# Patient Record
Sex: Female | Born: 1950 | Race: Black or African American | Hispanic: No | Marital: Married | Smoking: Current every day smoker
Health system: Southern US, Community
[De-identification: ages and names within clinical notes are randomized; demographics above are authoritative.]

## PROBLEM LIST (undated history)

## (undated) DIAGNOSIS — F329 Major depressive disorder, single episode, unspecified: Secondary | ICD-10-CM

## (undated) DIAGNOSIS — E119 Type 2 diabetes mellitus without complications: Secondary | ICD-10-CM

## (undated) DIAGNOSIS — I10 Essential (primary) hypertension: Secondary | ICD-10-CM

## (undated) DIAGNOSIS — F32A Depression, unspecified: Secondary | ICD-10-CM

## (undated) DIAGNOSIS — J45909 Unspecified asthma, uncomplicated: Secondary | ICD-10-CM

## (undated) DIAGNOSIS — M1990 Unspecified osteoarthritis, unspecified site: Secondary | ICD-10-CM

## (undated) DIAGNOSIS — F419 Anxiety disorder, unspecified: Secondary | ICD-10-CM

## (undated) DIAGNOSIS — R450 Nervousness: Secondary | ICD-10-CM

## (undated) HISTORY — DX: Major depressive disorder, single episode, unspecified: F32.9

## (undated) HISTORY — PX: GASTRIC BYPASS: SHX52

## (undated) HISTORY — DX: Anxiety disorder, unspecified: F41.9

## (undated) HISTORY — DX: Depression, unspecified: F32.A

## (undated) HISTORY — DX: Type 2 diabetes mellitus without complications: E11.9

---

## 1999-08-21 ENCOUNTER — Encounter: Payer: Self-pay | Admitting: Emergency Medicine

## 1999-08-21 ENCOUNTER — Inpatient Hospital Stay (HOSPITAL_COMMUNITY): Admission: EM | Admit: 1999-08-21 | Discharge: 1999-08-24 | Payer: Self-pay | Admitting: Emergency Medicine

## 2001-03-20 ENCOUNTER — Emergency Department (HOSPITAL_COMMUNITY): Admission: EM | Admit: 2001-03-20 | Discharge: 2001-03-20 | Payer: Self-pay | Admitting: *Deleted

## 2004-05-07 ENCOUNTER — Ambulatory Visit: Payer: Self-pay | Admitting: Psychiatry

## 2004-05-07 ENCOUNTER — Inpatient Hospital Stay (HOSPITAL_COMMUNITY): Admission: RE | Admit: 2004-05-07 | Discharge: 2004-05-10 | Payer: Self-pay | Admitting: Psychiatry

## 2004-06-23 ENCOUNTER — Other Ambulatory Visit (HOSPITAL_COMMUNITY): Admission: RE | Admit: 2004-06-23 | Discharge: 2004-07-10 | Payer: Self-pay | Admitting: Psychiatry

## 2004-06-23 ENCOUNTER — Ambulatory Visit: Payer: Self-pay | Admitting: Psychiatry

## 2005-04-29 ENCOUNTER — Emergency Department (HOSPITAL_COMMUNITY): Admission: EM | Admit: 2005-04-29 | Discharge: 2005-04-29 | Payer: Self-pay | Admitting: Emergency Medicine

## 2006-09-15 ENCOUNTER — Emergency Department (HOSPITAL_COMMUNITY): Admission: EM | Admit: 2006-09-15 | Discharge: 2006-09-15 | Payer: Self-pay | Admitting: Emergency Medicine

## 2006-10-26 ENCOUNTER — Ambulatory Visit (HOSPITAL_COMMUNITY): Admission: RE | Admit: 2006-10-26 | Discharge: 2006-10-26 | Payer: Self-pay | Admitting: General Surgery

## 2008-01-31 ENCOUNTER — Encounter: Payer: Self-pay | Admitting: Emergency Medicine

## 2008-01-31 ENCOUNTER — Inpatient Hospital Stay (HOSPITAL_COMMUNITY): Admission: AD | Admit: 2008-01-31 | Discharge: 2008-02-02 | Payer: Self-pay | Admitting: Cardiology

## 2008-02-27 ENCOUNTER — Ambulatory Visit (HOSPITAL_COMMUNITY): Admission: RE | Admit: 2008-02-27 | Discharge: 2008-02-27 | Payer: Self-pay | Admitting: General Surgery

## 2008-03-06 ENCOUNTER — Inpatient Hospital Stay (HOSPITAL_BASED_OUTPATIENT_CLINIC_OR_DEPARTMENT_OTHER): Admission: RE | Admit: 2008-03-06 | Discharge: 2008-03-06 | Payer: Self-pay | Admitting: Cardiology

## 2008-03-12 ENCOUNTER — Ambulatory Visit (HOSPITAL_COMMUNITY): Admission: RE | Admit: 2008-03-12 | Discharge: 2008-03-12 | Payer: Self-pay | Admitting: General Surgery

## 2008-05-14 ENCOUNTER — Encounter: Admission: RE | Admit: 2008-05-14 | Discharge: 2008-05-14 | Payer: Self-pay | Admitting: General Surgery

## 2008-08-07 ENCOUNTER — Ambulatory Visit: Payer: Self-pay | Admitting: Radiology

## 2008-08-07 ENCOUNTER — Encounter: Payer: Self-pay | Admitting: Emergency Medicine

## 2008-08-07 ENCOUNTER — Inpatient Hospital Stay (HOSPITAL_COMMUNITY): Admission: EM | Admit: 2008-08-07 | Discharge: 2008-08-09 | Payer: Self-pay | Admitting: Internal Medicine

## 2008-08-07 ENCOUNTER — Ambulatory Visit: Payer: Self-pay | Admitting: Cardiovascular Disease

## 2008-08-08 ENCOUNTER — Encounter (INDEPENDENT_AMBULATORY_CARE_PROVIDER_SITE_OTHER): Payer: Self-pay | Admitting: Internal Medicine

## 2008-10-08 ENCOUNTER — Ambulatory Visit (HOSPITAL_COMMUNITY): Admission: RE | Admit: 2008-10-08 | Discharge: 2008-10-08 | Payer: Self-pay | Admitting: General Surgery

## 2010-02-01 IMAGING — CT CT ANGIO CHEST
2 of 7 series · 19 of 36 positions shown · IV contrast (APPLIED)
Comparison: Portable chest 01/31/2008.

CLINICAL DATA: Elevated D-dimer.  Chest pain.

CT ANGIOGRAPHY CHEST
TECHNIQUE: Multidetector CT imaging of the chest using the
standard protocol during bolus administration of intravenous
contrast. Multiplanar reconstructed images including MIPs were
obtained and reviewed to evaluate the vascular anatomy.
Contrast: 100 ml [id number].

[Series 8: pulm embolism 1.0 thins · axial · 0.73mm/px · z∈[+1138,+1400]mm · 18 of 293 slices shown]
[im 16/293  lung]
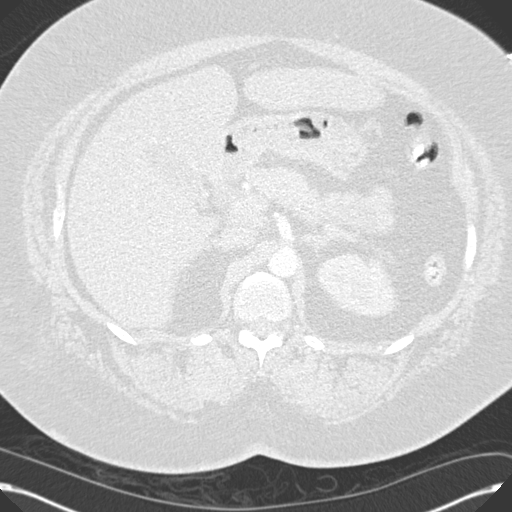
[im 31/293  mediastinal]
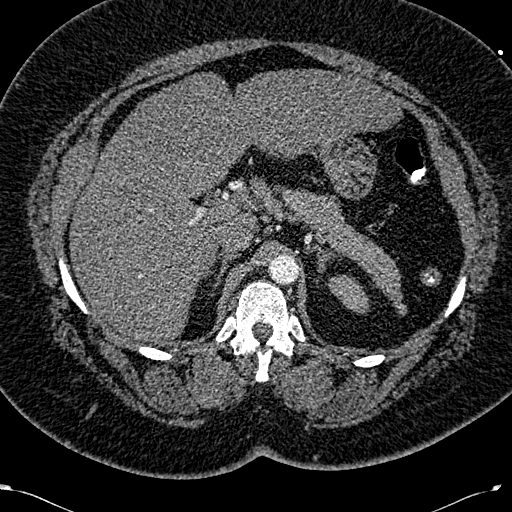
[im 47/293  lung]
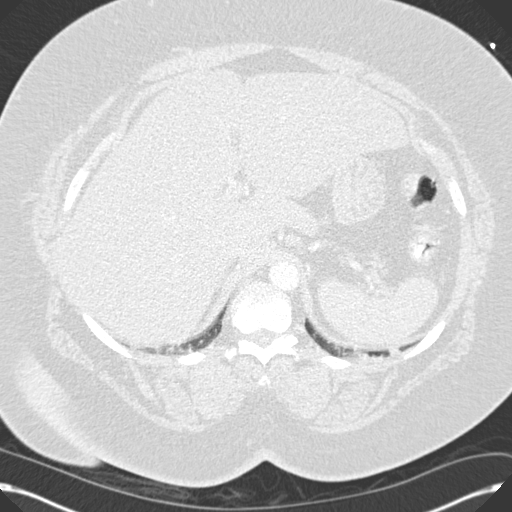
[im 62/293  mediastinal]
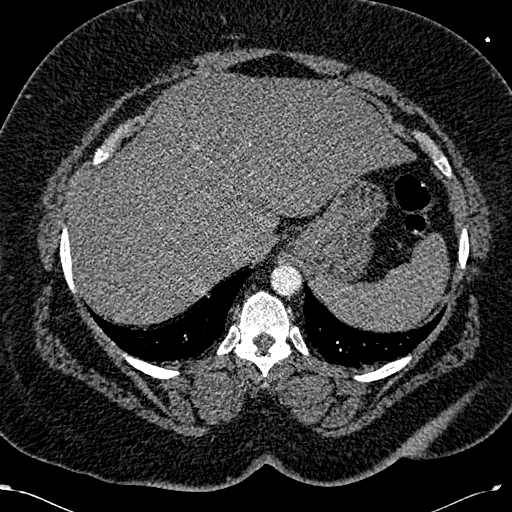
[im 77/293  lung]
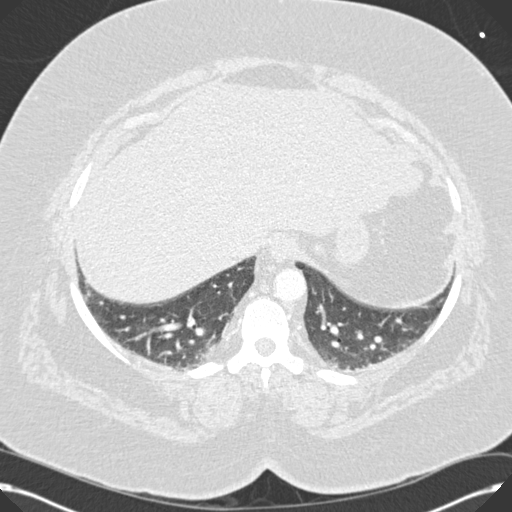
[im 93/293  mediastinal]
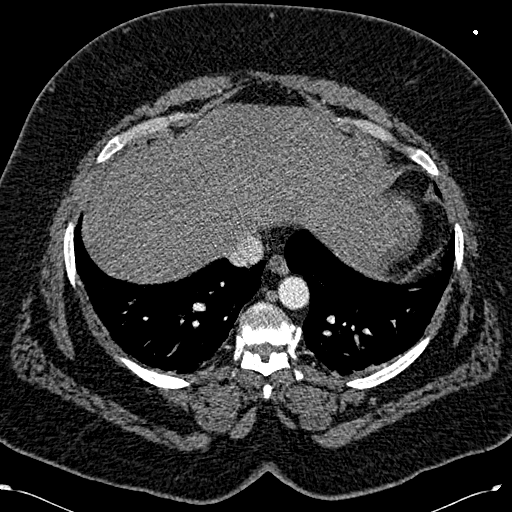
[im 108/293  lung]
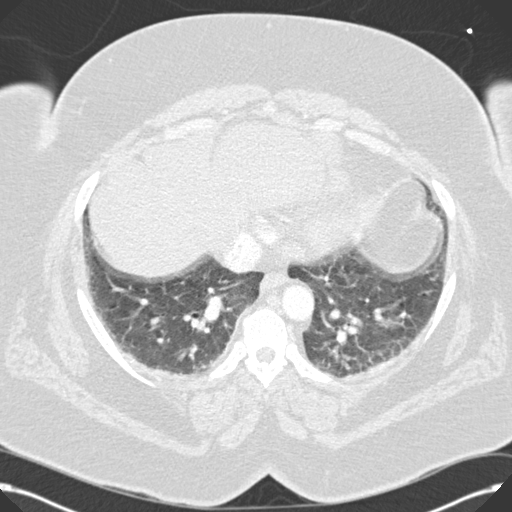
[im 123/293  mediastinal]
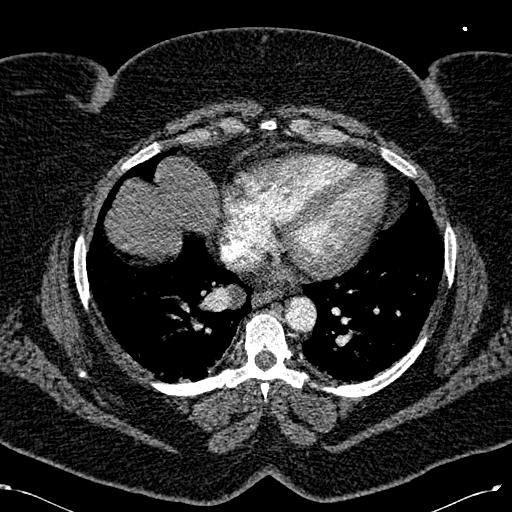
[im 139/293  lung]
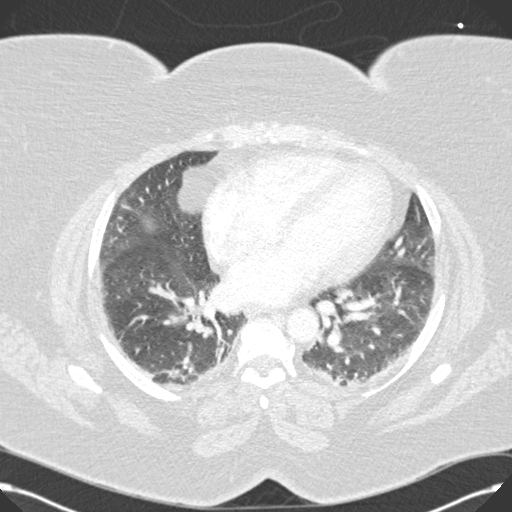
[im 154/293  mediastinal]
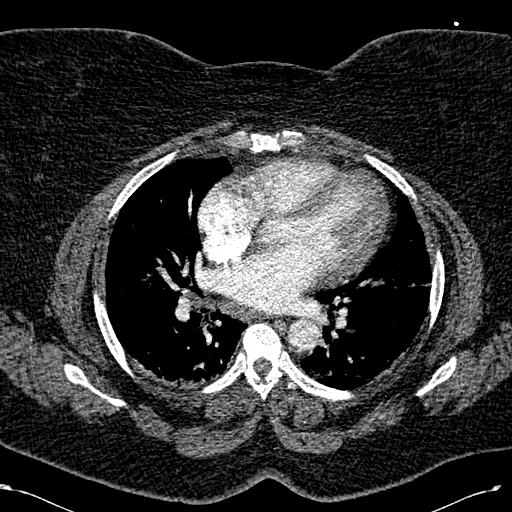
[im 170/293  lung]
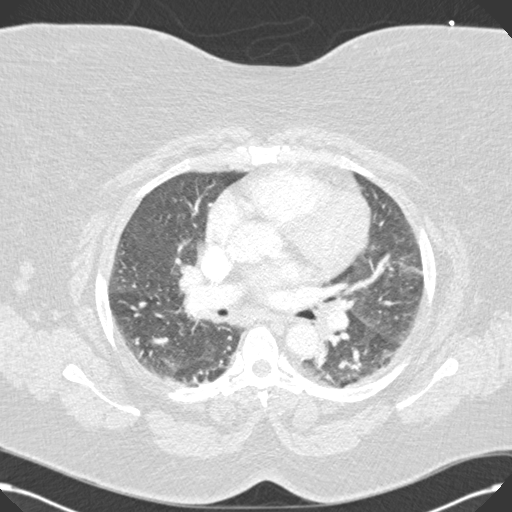
[im 185/293  mediastinal]
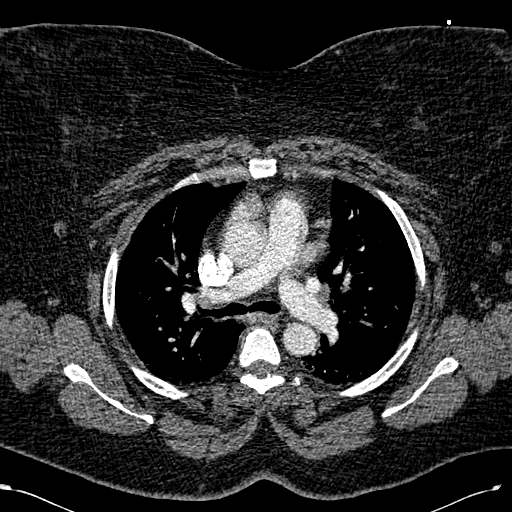
[im 200/293  lung]
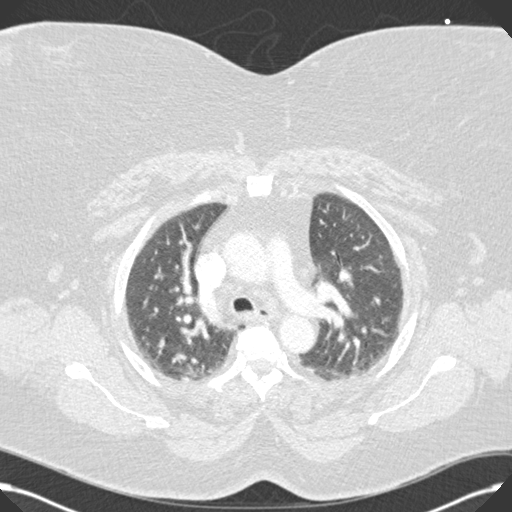
[im 216/293  mediastinal]
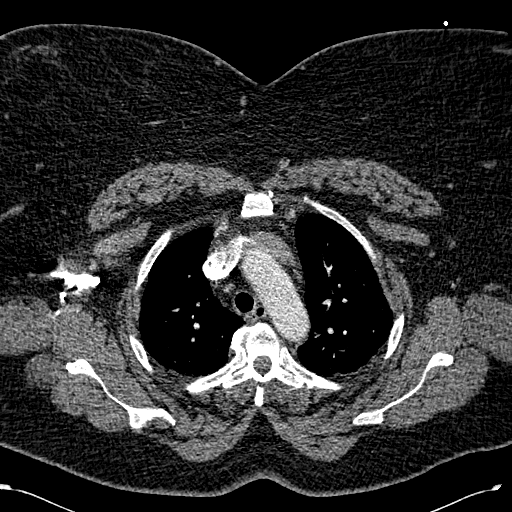
[im 231/293  lung]
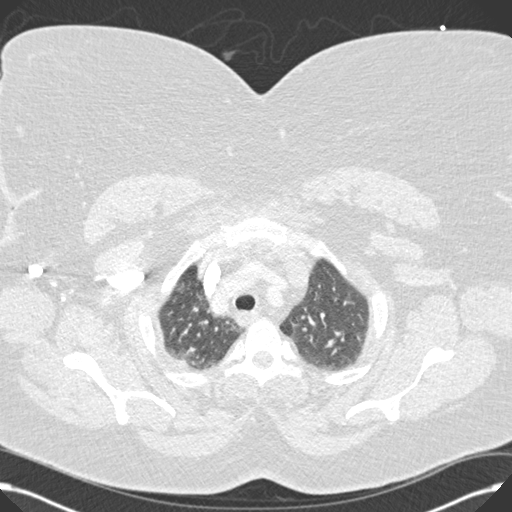
[im 246/293  mediastinal]
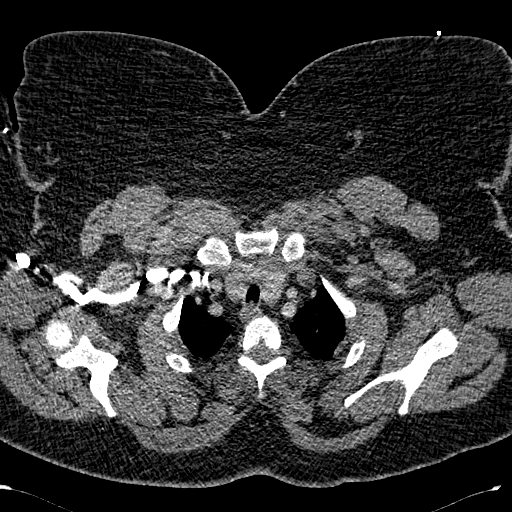
[im 262/293  lung]
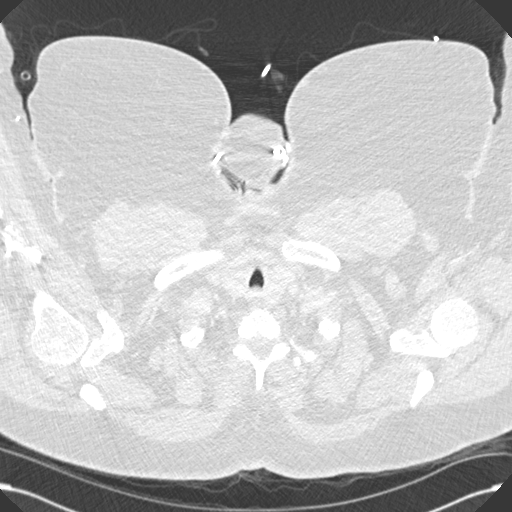
[im 277/293  mediastinal]
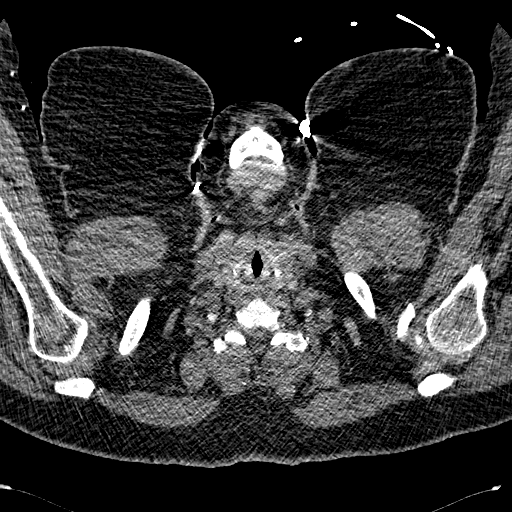

[Series 10: pulm embolism 2.0 cor · coronal · 0.74mm/px · 1 of 149 slices shown]
[im 75/149  mediastinal]
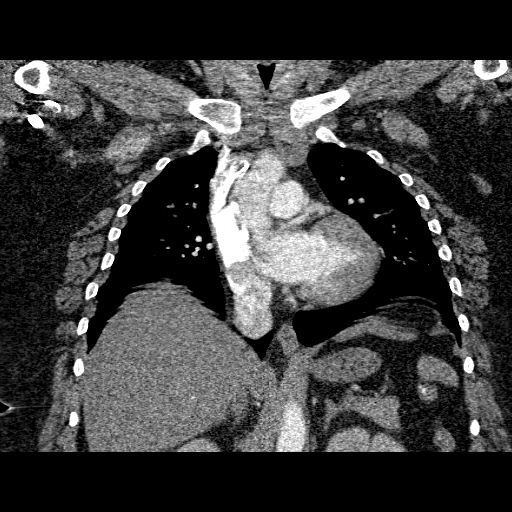

[19 of 36 positions shown; findings below may reference images not displayed]

FINDINGS: The study is somewhat limited by the patient's body
habitus. No CT evidence of pulmonary embolus is identified.  No
axillary, hilar or mediastinal lymphadenopathy is present.  There
is no pleural or pericardial effusion.  The lungs demonstrate some
scattered atelectatic change but are otherwise unremarkable.
Incidentally imaged upper abdomen is also unremarkable.  There is
no focal bony abnormality.
IMPRESSION: Negative for pulmonary embolus or acute cardiopulmonary disease.

## 2010-04-12 ENCOUNTER — Encounter: Payer: Self-pay | Admitting: Family Medicine

## 2010-07-01 LAB — COMPREHENSIVE METABOLIC PANEL
AST: 35 U/L (ref 0–37)
Albumin: 3.5 g/dL (ref 3.5–5.2)
BUN: 10 mg/dL (ref 6–23)
Calcium: 9.2 mg/dL (ref 8.4–10.5)
Chloride: 101 mEq/L (ref 96–112)
Creatinine, Ser: 1.04 mg/dL (ref 0.4–1.2)
GFR calc Af Amer: >60 mL/min (ref >60)
Total Protein: 8.2 g/dL (ref 6.0–8.3)

## 2010-07-01 LAB — CBC
HCT: 48.3 % — ABNORMAL HIGH (ref 36.0–46.0)
Hemoglobin: 16.1 g/dL — ABNORMAL HIGH (ref 12.0–15.0)
MCHC: 33.4 g/dL (ref 30.0–36.0)
MCV: 91.4 fL (ref 78.0–100.0)
Platelets: 249 K/uL (ref 150–400)
RBC: 5.28 MIL/uL — ABNORMAL HIGH (ref 3.87–5.11)
RDW: 14 % (ref 11.5–15.5)
WBC: 8.9 K/uL (ref 4.0–10.5)

## 2010-07-01 LAB — DIFFERENTIAL
Basophils Absolute: 0.1 K/uL (ref 0.0–0.1)
Basophils Relative: 1 % (ref 0–1)
Eosinophils Absolute: 0.4 K/uL (ref 0.0–0.7)
Eosinophils Relative: 4 % (ref 0–5)
Lymphocytes Relative: 42 % (ref 12–46)
Lymphs Abs: 3.8 K/uL (ref 0.7–4.0)
Monocytes Absolute: 0.6 10*3/uL (ref 0.1–1.0)
Monocytes Relative: 7 % (ref 3–12)
Neutro Abs: 4 10*3/uL (ref 1.7–7.7)
Neutrophils Relative %: 46 % (ref 43–77)

## 2010-07-01 LAB — CULTURE, BLOOD (ROUTINE X 2)
Culture: NO GROWTH
Culture: NO GROWTH

## 2010-07-01 LAB — CARDIAC PANEL(CRET KIN+CKTOT+MB+TROPI)
CK, MB: 3.2 ng/mL (ref 0.3–4.0)
CK, MB: 3.2 ng/mL (ref 0.3–4.0)
Relative Index: INVALID (ref 0.0–2.5)
Relative Index: INVALID (ref 0.0–2.5)
Total CK: 70 U/L (ref 7–177)
Total CK: 81 U/L (ref 7–177)
Total CK: 94 U/L (ref 7–177)
Troponin I: 0.01 ng/mL (ref 0.00–0.06)

## 2010-07-01 LAB — BASIC METABOLIC PANEL WITH GFR
BUN: 11 mg/dL (ref 6–23)
Chloride: 103 meq/L (ref 96–112)
Creatinine, Ser: 0.8 mg/dL (ref 0.4–1.2)
GFR calc non Af Amer: >60 mL/min (ref >60)
Glucose, Bld: 98 mg/dL (ref 70–99)

## 2010-07-01 LAB — BASIC METABOLIC PANEL
CO2: 27 mEq/L (ref 19–32)
CO2: 27 mEq/L (ref 19–32)
Calcium: 9.2 mg/dL (ref 8.4–10.5)
Chloride: 100 mEq/L (ref 96–112)
GFR calc Af Amer: >60 mL/min (ref >60)
GFR calc non Af Amer: 59 mL/min — ABNORMAL LOW (ref >60)
Glucose, Bld: 180 mg/dL — ABNORMAL HIGH (ref 70–99)
Potassium: 4.6 mEq/L (ref 3.5–5.1)
Potassium: 4.8 mEq/L (ref 3.5–5.1)
Sodium: 137 mEq/L (ref 135–145)
Sodium: 140 mEq/L (ref 135–145)

## 2010-07-01 LAB — BLOOD GAS, ARTERIAL
Bicarbonate: 26.2 mEq/L — ABNORMAL HIGH (ref 20.0–24.0)
Bicarbonate: 28.1 mEq/L — ABNORMAL HIGH (ref 20.0–24.0)
Delivery systems: POSITIVE
Drawn by: 24610
Expiratory PAP: 4
FIO2: 0.4 %
Mode: POSITIVE
O2 Content: 4 L/min
O2 Saturation: 96.4 %
Patient temperature: 98.6
TCO2: 29.8 mmol/L (ref 0–100)
pCO2 arterial: 49.2 mmHg — ABNORMAL HIGH (ref 35.0–45.0)
pH, Arterial: 7.328 — ABNORMAL LOW (ref 7.350–7.400)
pO2, Arterial: 66.2 mmHg — ABNORMAL LOW (ref 80.0–100.0)

## 2010-07-01 LAB — GLUCOSE, CAPILLARY
Glucose-Capillary: 155 mg/dL — ABNORMAL HIGH (ref 70–99)
Glucose-Capillary: 163 mg/dL — ABNORMAL HIGH (ref 70–99)
Glucose-Capillary: 183 mg/dL — ABNORMAL HIGH (ref 70–99)
Glucose-Capillary: 163 mg/dL — ABNORMAL HIGH (ref 70–99)

## 2010-07-01 LAB — POCT CARDIAC MARKERS
CKMB, poc: 1.8 ng/mL (ref 1.0–8.0)
Myoglobin, poc: 15 ng/mL (ref 12–200)
Troponin i, poc: <0.05 ng/mL (ref 0.00–0.09)

## 2010-07-01 LAB — POCT B-TYPE NATRIURETIC PEPTIDE (BNP): B Natriuretic Peptide, POC: 9.3 pg/mL (ref 0–100)

## 2010-07-01 LAB — LIPID PANEL: Cholesterol: 128 mg/dL (ref 0–200)

## 2010-07-01 LAB — HEMOGLOBIN A1C: Mean Plasma Glucose: 134 mg/dL

## 2010-08-05 NOTE — Consult Note (Signed)
NAMEHALEI, HANOVER NO.:  [id number]   MEDICAL RECORD NO.:  [id number]          PATIENT TYPE:  EMS   LOCATION:  MAJO                         FACILITY:  [facility]   PHYSICIAN:  Sharlet Salina T. Hoxworth, M.D.DATE OF BIRTH:  [birth year]   DATE OF CONSULTATION:  09/15/2006  DATE OF DISCHARGE:                                 CONSULTATION   CHIEF COMPLAINT:  Rectal pain and bleeding.   HISTORY OF PRESENT ILLNESS:  I was asked by Dr. Bruce Donath in the  emergency room at [facility] to evaluate [name].  She is a 60-  year-old black female who presents to the emergency room with anal pain.  She originally was seen at Dr. Tedra Senegal office today.  She complains of  probably 6-8 weeks of pain with bowel movements at her anus.  This  initially was tolerable.  It has gradually worsened.  She does have to  strain some with her stools.  Over the past 2-3 weeks, she has had a  small amount of blood with bowel movements.  Bowel movements have become  extremely painful.  The pain was much worse today and she presented.  She has had some mild constipation and straining.  She has no history of  any similar symptoms.  No fever or chills.  No drainage other than the  small amount of blood with bowel movements.   PAST MEDICAL HISTORY:  She is followed for diabetes mellitus,  hypertension and asthma.   PREVIOUS SURGERY INCLUDES:  Ovarian cystectomy.   MEDICATIONS:  Glucophage.   ALLERGIES:  NO KNOWN DRUG ALLERGIES.   SOCIAL HISTORY:  She is married, accompanied by her husband.  Denies  cigarettes, alcohol.   FAMILY HISTORY:  Noncontributory.   REVIEW OF SYSTEMS:  GENERAL:  No fever, chills.  ABDOMEN:  No abdominal  pain, nausea, vomiting.   PHYSICAL EXAM:  She is afebrile, heart rate is 109, blood pressure  121/77, respirations 22.  GENERAL:  She is an obese, black female, appears uncomfortable.  SKIN:  Slightly diaphoretic.  CHEST:  Clear without wheeze or increased work of  breathing.  CARDIAC:  Regular mild tachycardia.  No murmurs.  ABDOMEN:  Soft, nontender.  No masses or organomegaly.  RECTAL:  She is exquisitely tender on rectal exam.  The internal anal  sphincter feels hypertrophied.  There is a small amount of blood on the  examining finger.  Her tenderness is discrete in the posterior midline.  There is no perirectal induration, swelling or tenderness.   LABORATORY AND X-RAY DATA:  Per the physician assist in the emergency  room, CBC was normal.   ASSESSMENT/PLAN:  Rectal pain and bleeding entirely consistent with an  anal fissure.  I do not see any evidence of abscess or infection.  Going  to go ahead and start her on diltiazem cream perianally to try to relax  the internal sphincter, as well as topical anesthetics and stool  softeners.  I will see her back in 7-10 days in the office.  This may  require lateral internal anal sphincterotomy.  Lorne Skeens. Hoxworth, M.D.  Electronically Signed     BTH/MEDQ  D:  09/15/2006  T:  09/16/2006  Job:  [id number]   cc:   Renaye Rakers, M.D.

## 2010-08-05 NOTE — Cardiovascular Report (Signed)
[name], [name] NO.:  [id number]   MEDICAL RECORD NO.:  [id number]          PATIENT TYPE:  OIB   LOCATION:  1962                         FACILITY:  [facility]   PHYSICIAN:  Mohan N. Sharyn Lull, M.D. DATE OF BIRTH:  [birth year]   DATE OF PROCEDURE:  03/06/2008  DATE OF DISCHARGE:  03/06/2008                            CARDIAC CATHETERIZATION   PROCEDURE:  Left cardiac catheterization with selective left and right  coronary angiography, left ventriculography via right groin using  Judkins technique.   INDICATIONS FOR PROCEDURE:  [name] is a 60 year old black female  with past medical history significant for hypertension, non-insulin-  dependent diabetes mellitus, hypercholesteremia, morbid obesity, and  unstable angina.  She had mildly positive Persantine Myoview, recently  discharged from the hospital, history of bronchial asthma, and positive  family history of coronary artery disease, who came to office  complaining of retrosternal chest tightness off and on grade 4/10  radiating to the left arm associated with numbness in the left arm,  relieved with rest.  Also gives history of exertional dyspnea with  minimal exertion.  Denies any nausea, vomiting, diaphoresis, PND,  orthopnea, or leg swelling.  Denies palpitation, lightheadedness, or  syncope.  The patient initially decided for medical management, recently  discharged from the hospital, had Persantine Myoview which showed mild  area of ischemia in the apex but due to recurrent chest pain decided to  proceed with left cath.  Discussed with the patient at length regarding  risks and benefits, i.e., death, MI, stroke, local vascular  complications, bleeding, infection, etc., and consented for the  procedure.   PROCEDURE:  After obtaining the informed consent, the patient was  brought to the Cath Lab and was placed on fluoroscopy table.  The right  groin was prepped and draped in the usual fashion.  Xylocaine  1% was  used for local anesthesia in the right groin.  With the help of thin-  wall needle, a 4-French arterial sheath was placed.  The sheath was  aspirated and flushed.  Next, a 4-French left Judkins catheter was  advanced over the wire under fluoroscopic guidance up to the ascending  aorta.  Wire was pulled out.  The catheter was aspirated and connected  to the manifold.  Catheter was further advanced and engaged into left  coronary ostium.  Multiple views of the left system were taken.  Next,  catheter was engaged and was pulled out over the wire and was replaced  with a 4-French 3-D right diagnostic catheter which was advanced over  the wire under fluoroscopic guidance up to the ascending aorta.  Wire  was pulled out.  The catheter was aspirated and connected to the  manifold.  Catheter was further advanced and engaged into right coronary  ostium.  Multiple views of the right system were obtained.  Next, the  catheter was disengaged and was pulled out over the wire and was  replaced with 4-French pigtail catheter which was advanced over the wire  under fluoroscopic guidance up to the ascending aorta.  Wire was pulled  out.  The catheter  was aspirated and connected to the manifold.  Catheter was further advanced across aortic valve into the LV.  LV  pressures were recorded.  Next, LV graft was done in 30-degree RAO  position.  Post angio, LV pressures were recorded.  Next, LV-graphy was  done in 30-degree RAO position.  Postangiographic pressures were  recorded from LV and then pullback pressures were recorded from the  aorta.  There was no significant gradient across aortic valve.  Next,  the pigtail catheter was pulled out over the wire.  Sheaths were  aspirated and flushed.   FINDINGS:  LV showed good LV systolic function, EF of 60-65%.  Left main  was long which was patent.  LAD was small which was patent but diffusely  diseased distally.  There were no focal stenoses.  Diagonal  1 and 2 were  very, very small.  Left circumflex was patent which tapers down after  giving off very large OM-1 into the AV groove.  OM-1 is very large which  is patent.  RCA is patent proximally and has about 10% mid stenosis.  PDA is patent.  PLV branches are small which are patent.   The patient tolerated the procedure well.  There are no complications.  The patient was transferred to the recovery room in stable condition.      [name]. Sharyn Lull, M.D.  Electronically Signed     MNH/MEDQ  D:  03/06/2008  T:  03/06/2008  Job:  [id number]   cc:   Cath Lab

## 2010-08-05 NOTE — Discharge Summary (Signed)
NAMELEMOYNE, SCARPATI NO.:  [id number]   MEDICAL RECORD NO.:  [id number]          PATIENT TYPE:  INP   LOCATION:  2039                         FACILITY:  [facility]   PHYSICIAN:  Eduardo Osier. Sharyn Lull, M.D. DATE OF BIRTH:  05-07-1950   DATE OF ADMISSION:  01/31/2008  DATE OF DISCHARGE:  02/02/2008                               DISCHARGE SUMMARY   ADMITTING DIAGNOSES:  1. Chest pain, rule out myocardial infarction.  2. Abdominal pain, rule out gastritis.  3. Gallbladder disease.  4. Hypertension.  5. Morbid obesity.  6. History of bronchial asthma.  7. Diabetes mellitus.  8. Positive family history of coronary artery disease.   FINAL DIAGNOSES:  1. Status post chest pain, myocardial infarction ruled out, mildly      positive Persantine Myoview.  2. Hypertension.  3. Non-insulin-dependent diabetes mellitus.  4. Hypercholesteremia.  5. Morbid obesity.  6. History of bronchial asthma.  7. Positive family history of coronary artery disease.   DISCHARGE MEDICATIONS:  1. Enteric-coated aspirin 81 mg 1 tablet daily.  2. Toprol XL 50 mg 1 tablet daily.  3. Lisinopril 5 mg 1 tablet daily.  4. Vistaril 100 mg 1 tablet daily.  5. Protonix 40 mg 1 tablet twice daily.  6. Metformin 500 mg 1 tablet daily as before.  7. Nitrostat 0.4 mg sublingually as directed.   DIET:  Low salt, low cholesterol, 1800 calories ADA diet.  The patient  has been advised to reduce weight.  The patient had lengthy discussion  with nutrition and agrees with changing her lifestyle and reducing  weight.   ACTIVITY:  As tolerated.  The patient has been advised to monitor blood  sugar daily.   FOLLOWUP:  Follow up with me in 1 week.   CONDITION AT DISCHARGE:  Stable   BRIEF HISTORY AND HOSPITAL COURSE:  Ms. Vasko is a 60 year old black  female with past medical history significant for hypertension, non-  insulin-dependent diabetes mellitus, history of bronchial asthma, and  morbid obesity.   [facility] complaining of  retrosternal and epigastric and right upper quadrant abdominal pain  associated with nausea and diaphoresis.  She states chest pain was sharp  and tightness, lasted for few seconds to a minute, which also increased  with deep breathing.  The patient was noted to have minimally elevated  troponin I and was transferred to [facility] for further  evaluation.  Denies any history of exertional chest pain.  She states  for last few days, she is having abdominal pain and right upper quadrant  pain off and on.  Denies any fever or chills.  Denies any cough.  Denies  any urinary complaints.   PAST MEDICAL HISTORY:  As above.   PAST SURGICAL HISTORY:  She had ovarian cyst resection at the age of 60,  had rectal fissure surgery in the past.   SOCIAL HISTORY:  She is married, no children, smoked 4-5 cigarettes per  day when under distress in the past and drinks beer socially.  Smoked  marijuana many years ago.  No  history of cocaine abuse.   FAMILY HISTORY:  Father died of MI at the age of 58.  Mother died of  complications of polio at the age of 76.  One sister died of MI at the  age of 63.  One sister had MI at age of 58.  Two brothers in good  health.   MEDICATION AT HOME:  She is on Glucophage and questionable blood  pressure medicine.   PHYSICAL EXAMINATION:  GENERAL:  On examination, she is alert, awake,  and oriented x3, in no acute distress.  VITAL SIGNS:  Blood pressure was 102/77, pulse was 88 regular.  HEENT:  Conjunctivae was pink.  NECK:  Supple.  No JVD.  No bruit.  LUNGS:  Clear to auscultation without rhonchi and rales.  CARDIOVASCULAR:  S1 and S2 was normal.  There was soft systolic murmur.  There was no S3 or S4 gallop.  ABDOMEN:  Soft, obese.  There was mild epigastric and right upper  quadrant tenderness.  There was no guarding.  EXTREMITIES:  There is no clubbing, cyanosis, or edema.   LABS:  Hemoglobin  was 17.2, hematocrit 52.6, and white count of 10.9.  BNP was less than 5.  Sodium was 136, potassium 4.5, glucose 110, BUN  13, and creatinine 1.0.  Her amylase and lipase were normal.  Three sets  of cardiac markers were normal.  D-dimer was slightly elevated 1.04.  She had spiral CT of the chest, which showed no evidence of pulmonary  embolism.  Chest x-ray showed no acute cardiopulmonary process.  Persantine Myoview scan showed mild reduce activity in the cardiac cath  and on the stress images suspicious for small apical inducible ischemia  with mild apical hypokinesia with EF of 59%.  Her cholesterol was 131,  HDL was low 30, and LDL was 80.   BRIEF HOSPITAL COURSE:  The patient was transferred from [facility] to stepdown unit.  MI was ruled out by serial enzymes and EKG.  The patient underwent subsequently Persantine Myoview, which showed very  mild area of reversible ischemia in the apex as above.  The patient did  not had any episodes of typical anginal chest pain in the hospital.  The  patient has been ambulating in hallway without any problems.  Discussed  with the patient at length regarding mildly positive Persantine Myoview  and various options of treatment i.e. medical versus invasive left cath  possible PTCA stenting, risks and benefits and agreed for medical  management for now.  The  patient understands that when she gets recurrent chest pressure will use  nitro and call 911 immediately and may consider cardiac cath as an  outpatient if she gets recurrent chest pain.  The patient will be  discharged home today and will be followed up in my office in 1 week.      Eduardo Osier. Sharyn Lull, M.D.  Electronically Signed     MNH/MEDQ  D:  02/02/2008  T:  02/03/2008  Job:  [id number]

## 2010-08-05 NOTE — Op Note (Signed)
[name], [name]                ACCOUNT NO.:  [id number]   MEDICAL RECORD NO.:  [id number]          PATIENT TYPE:  AMB   LOCATION:  DAY                          FACILITY:  [facility]   PHYSICIAN:  Sharlet Salina T. Hoxworth, M.D.DATE OF BIRTH:  November 09, 1950   DATE OF PROCEDURE:  10/26/2006  DATE OF DISCHARGE:                               OPERATIVE REPORT   PRE AND POSTOPERATIVE DIAGNOSIS:  Anal fissure.   SURGICAL PROCEDURES:  Lateral internal anal sphincterotomy.   SURGEON:  Lorne Skeens. Hoxworth, M.D.   ANESTHESIA:  General.   BRIEF HISTORY:  [name] is a 60 year old female who presented  recently with worsening persistent severe anal pain, small amount of  bleeding with bowel movements.  Examination initially was consistent  with a posterior midline anal fissure and hypertrophy of the lateral end  of the internal anal sphincter.  She initially responded well to  diltiazem cream but now has recurrent severe symptoms.  I have  recommended proceeding with lateral internal anal sphincterotomy under  general anesthesia.  The nature of the procedure, the indications, risks  of anesthesia, bleeding, infection, recurrent fissure and degrees of  incontinence were discussed and understood.  She is now brought  operating room for this procedure.   DESCRIPTION OF OPERATION:  The patient brought operating room and placed  in supine position on the operating table and general endotracheal  anesthesia was induced.  She was carefully positioned in lithotomy  position and the perineum sterilely prepped and draped.  Correct patient  procedure were verified.  Rectal exam under anesthesia revealed a very  tight internal anal sphincter and a fairly deep posterior fissure and  small skin tag.  The anus had to be gently dilated to even admit the  small end of the Sims retractor.  The retractor was placed and the  internal anal sphincter was identified in the right lateral position and  was quite taut  and constricted.  A small incision was made in the  intersphincteric groove and hemostat inserted into the intersphincteric  groove and the internal anal sphincter elevated up into the small  incision and was divided with cautery with good relaxation of the  sphincter back to a normal state.  Hemostasis was obtained with pressure  and the perianal area was infiltrated with 20 mL of 0.25% Marcaine with  epinephrine.  The sponge needle instrument counts correct.  Gauze  dressing was applied.  The patient taken to recovery in good condition.      Lorne Skeens. Hoxworth, M.D.  Electronically Signed     BTH/MEDQ  D:  10/26/2006  T:  10/26/2006  Job:  [id number]

## 2010-08-08 IMAGING — CR DG CHEST 2V
2 series · 2 of 2 positions shown · non-contrast
Comparison: Radiographs 08/08/2008

CLINICAL DATA: Shortness of breath

CHEST - 2 VIEW

[w chest pa]
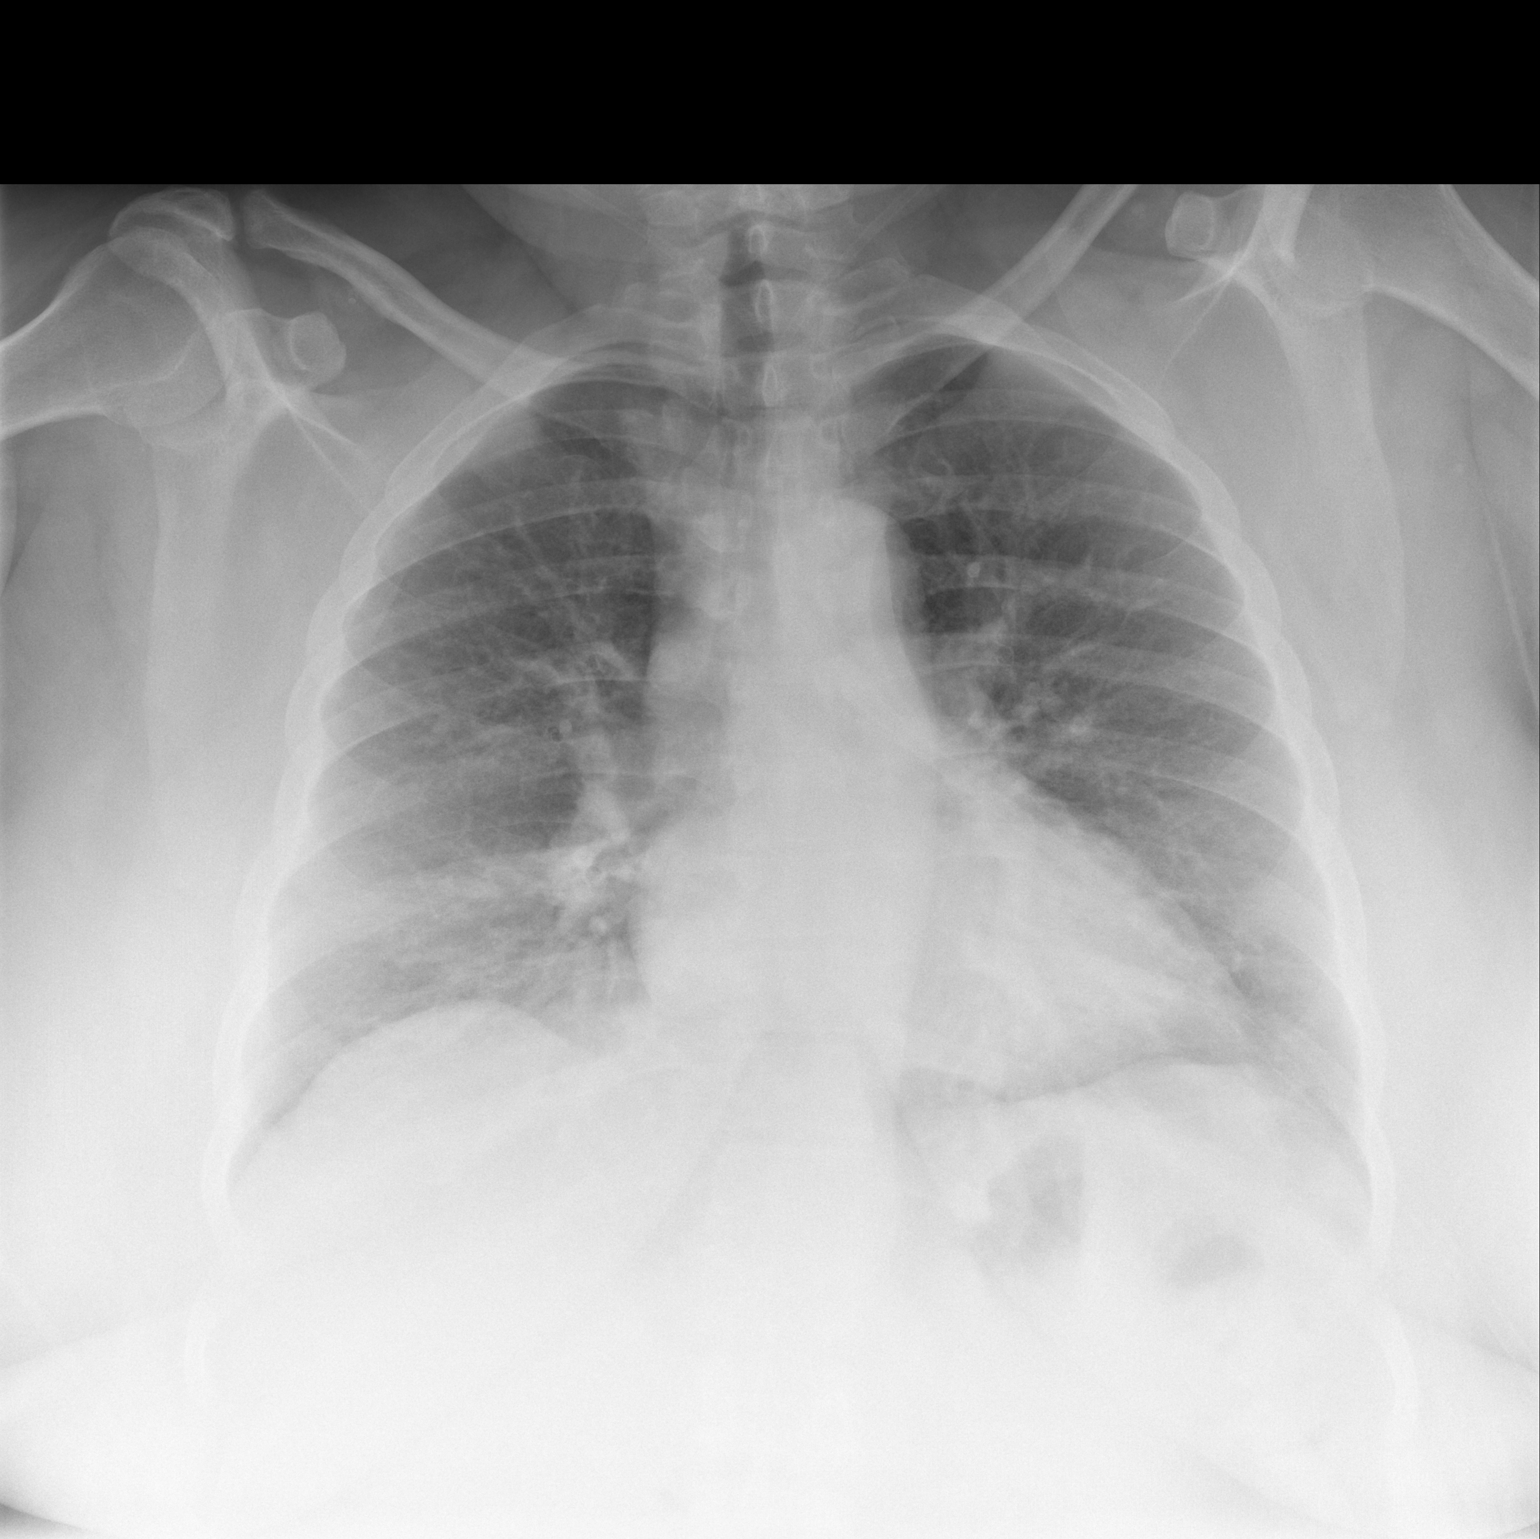

[w chest lat]
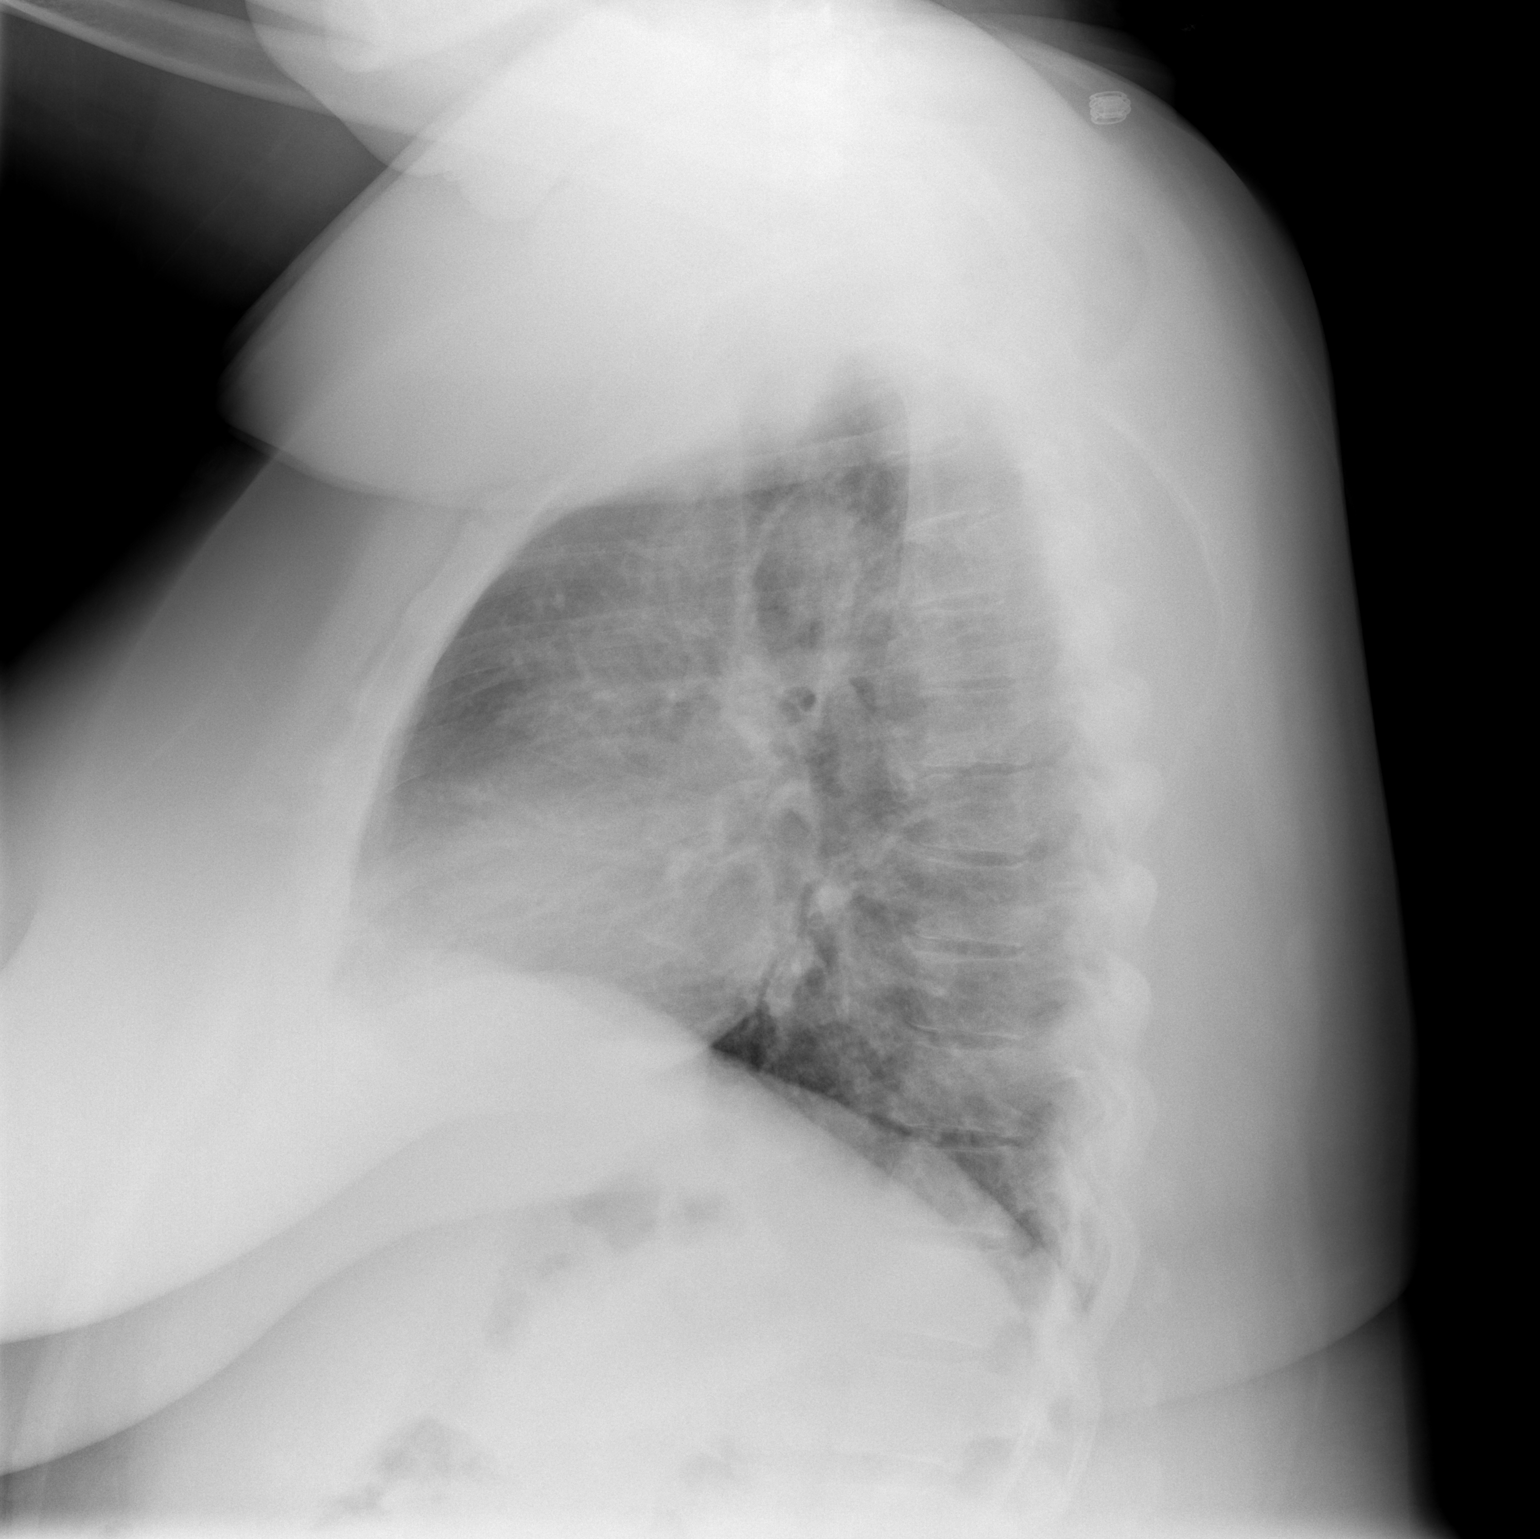

[2 of 2 positions shown; findings below may reference images not displayed]

FINDINGS: Heart upper normal.  No adenopathy.  Interstitial
densities are chronic.  Negative for edema, infiltrates or
effusions.
IMPRESSION: Chronic interstitial changes.  No acute cardiopulmonary process.

## 2010-08-08 IMAGING — CR DG CHEST 1V PORT
1 series · 1 of 1 positions shown · non-contrast
Comparison: January 31, 2008

CLINICAL DATA: Chest pain

PORTABLE CHEST - 1 VIEW

[view not recorded]
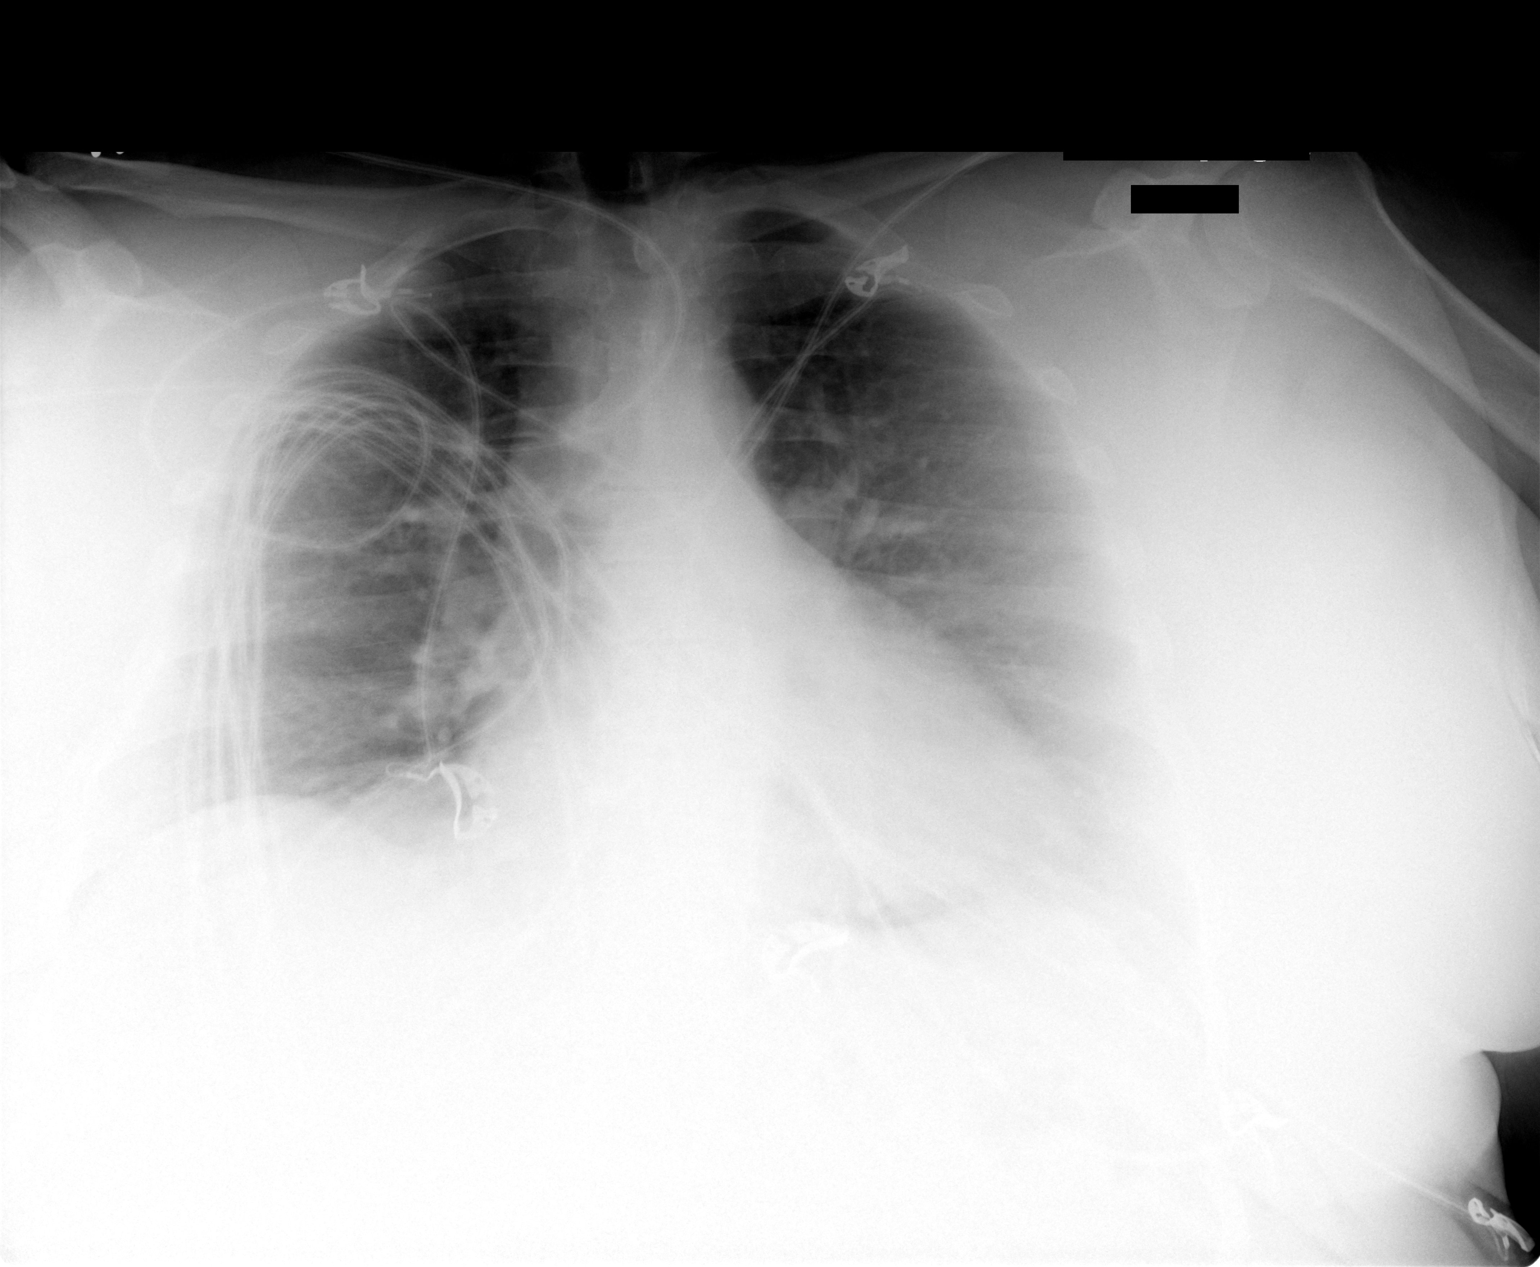

[1 of 1 positions shown; findings below may reference images not displayed]

FINDINGS: The cardiac silhouette, mediastinum, pulmonary
vasculature are within normal limits.  Both lungs are grossly clear
but the film is limited by underpenetration and motion.
IMPRESSION: Unremarkable, limited portable chest x-ray.

An upright PA and lateral chest series may be of help when the
patient is able.

## 2010-08-08 NOTE — Discharge Summary (Signed)
[name], DAURIA NO.:  [id number]   MEDICAL RECORD NO.:  [id number]          PATIENT TYPE:  INP   LOCATION:  2925                         FACILITY:  [facility]   PHYSICIAN:  Ruthy Dick, MD    DATE OF BIRTH:  [birth year]   DATE OF ADMISSION:  08/07/2008  DATE OF DISCHARGE:  08/09/2008                               DISCHARGE SUMMARY   The patient left against medical advise on Aug 09, 2008.   HISTORY OF PRESENT ILLNESS AND HOSPITAL COURSE:  This is a 60 year old  female who was transferred from [facility] for  respiratory distress secondary to asthma exacerbation.  She had come in  with shortness of breath and was treated with BiPAP.  Steroid was also  introduced.  She was beginning to get better, but before full recovery  and before I could see her on date Aug 09, 2008, she signed out against  medical advice.   FINAL DIAGNOSES:  1. Asthma exacerbation.  2. Respiratory failure to possible chronic obstructive pulmonary      disease exacerbation.  3. Diabetes mellitus type 2.  4. Hypertension.  5. Coronary artery disease.  6. Depression.  7. Dyslipidemia.   Because she signed against medical advice, arrangement could not be made  for outpatient followup of discharge medications and discharge  instructions.  She is always welcome to come back to the hospital if she  so desires.      Ruthy Dick, MD  Electronically Signed     GU/MEDQ  D:  09/04/2008  T:  09/04/2008  Job:  [id number]

## 2010-08-08 NOTE — Discharge Summary (Signed)
[facility]  Patient:    [name], [name]                         MRN: [id number] Adm. Date:  [id number] Disc. Date: [id number] Attending:  Beverely Low                           Discharge Summary  REASON FOR ADMISSION:  Asthma exacerbation.  DISCHARGE DIAGNOSIS:  Asthma.  HISTORY OF PRESENT ILLNESS:  The patient is a 60 year old black female who presented with acute asthma exacerbation.  The patient had been developing progressive wheezing and shortness of breath over the past week.  She has a history of asthma but had not been following up with her physician for her asthma managment.  In addition she is a patient who is a diabetic patient who also had not been compliant with her followup visits and medications.  She was started on albuterol nebulized treatments for her symptoms.  She was maintained on O2 to maintain her saturations greater than 92%.  She continued on her home meds.  The patient progressed to the point that her exacerbation had resolved.  She was discharged home in stable condition.  DISPOSITION:  The patient was to resume her regular diet and activity.  She was to follow up with Dr. Jeanella Anton in the office within one week.  DISCHARGE MEDICATIONS: 1. Albuterol inhaler two puffs q.4-6h. p.r.n. 2. Zithromax 250 mg one p.o. q.d. for three additional days. 3. Claritin 10 mg q.d. 4. Humibid DM one p.o. q.12h. 5. Prednisone 60 mg q.d. x 3 days, then 40 mg q.d. x 2 days, then 20 mg    q.d. x 2 days, then 10 mg q.d. x 3 days. DD:  08/31/99 TD:  09/05/99 Job: [id number]
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
## 2010-08-08 NOTE — H&P (Signed)
[name], [name] NO.:  [id number]   MEDICAL RECORD NO.:  [id number]          PATIENT TYPE:  INP   LOCATION:  2925                         FACILITY:  [facility]   PHYSICIAN:  Zannie Cove, MD     DATE OF BIRTH:  [birth year]   DATE OF ADMISSION:  08/07/2008  DATE OF DISCHARGE:  08/09/2008                              HISTORY & PHYSICAL   CHIEF COMPLAINT:  Shortness of breath.   HISTORY OF PRESENT ILLNESS:  The patient is a 60 year old female  transferred from [facility] for respiratory distress due  to asthma exacerbation.  The patient reports shortness of breath  associated with wheezing and cough productive of yellow sputum for about  1 week without improvement with nebs at home.  She went to work today  where it worsened, and sent to the ED.  In [facility] ER,  she received treatments of Xopenex, 125 of Solu-Medrol, albuterol and  Atrovent x1.  Subsequently, had to be placed on BiPAP due to worsening  symptoms.  Denies any sick contacts and reports compliance with  medications.  The patient also reports intermittent chest pain.   PAST MEDICAL HISTORY:  Significant for asthma, coronary artery disease,  diabetes, hypertension, depression, and hyperlipidemia.   MEDICATIONS:  1. Metformin 500 mg p.o. b.i.d.  2. Advair 250/50 one puff b.i.d.  3. Pro-Air 2-4 puffs p.r.n.  4. Sublingual nitroglycerin 0.4 mg p.o. p.r.n.  5. Toprol-XL 50 mg p.o. daily.  6. Lisinopril 5 mg daily.  7. Protonix 40 mg daily.  8. Crestor 10 mg daily.  9. Aspirin 325 mg daily.   ALLERGIES:  No known drug allergies.   PAST SURGICAL HISTORY:  Anal fissurectomy.   SOCIAL HISTORY:  Lives at home.  Her husband is a former smoker, quit  about 4 months ago.  Denies any alcohol or drug abuse.   FAMILY HISTORY:  Positive for asthma.   REVIEW OF SYSTEMS:  The patient reports intermittent chest pain.  Rest  all system reviewed negative expect for HPI.   PHYSICAL EXAMINATION:  VITAL SIGNS:  Temp is 97.9, pulse is 108, blood  pressure 118/72, respirations 20, and O2 sats are 97% on 50% BiPAP and  sating of 10/5.  GENERAL:  Alert, awake, oriented x3, on BiPAP, does not appear to be in  any distress.  NECK:  Obese.  JVD is not depreciated in nodules and accessory muscles.  CARDIOVASCULAR:  S1 and S2, regular rate and rhythm.  LUNGS:  Bilateral prolonged expiratory wheezes.  ABDOMEN:  Obese, soft, and nontender.  EXTREMITIES:  No edema, clubbing, or cyanosis.   LABORATORY DATA:  White count 8.9, hemoglobin 16.1, and platelets 249.  Sodium is pending for now.  Chloride 103, bicarb 27, BUN 11, creatinine  0.8, and glucose 98.  CK-MB is 1.8.  Troponin less than 0.05.  Chest x-  ray unremarkable on the limited portable film.  EKG with sinus rhythm,  rate is 94, no acute ST-T changes.   ASSESSMENT AND PLAN:  This is a 60 year old female with respiratory  failure secondary  to asthma exacerbation.  Continue BiPAP 10/5 on 50% O2  for now.  Check ABG as soon as possible and adjust accordingly.   Supplemental oxygen to keep sats greater than 90%.   DuoNeb q.4 h. and q.2 p.r.n.   IV Solu-Medrol 80 mg IV q.8.   Check BNP and 2 more sets of cardiac enzymes and also get a  echocardiogram as well.   Coronary artery disease appears stable at this point.  Continue aspirin,  hold Toprol for now due to asthma exacerbation.  Continue statin.  Check  cardiac enzymes x2 and get an echocardiogram.   Diabetes.  This patient is n.p.o. for now.  Hold the metformin while  n.p.o.  We will monitor her on sliding scale and fingersticks.   Hypertension, stable.  Continue lisinopril.   Deep vein thrombosis prophylaxis with Lovenox 40 mg subcu daily.   The patient is full code.      Zannie Cove, MD  Electronically Signed     PJ/MEDQ  D:  08/16/2008  T:  08/17/2008  Job:  [id number]

## 2010-08-08 NOTE — H&P (Signed)
[facility]  Patient:    [name], [name]                         MRN: [id number] Adm. Date:  [id number] Attending:  Beverely Low                         History and Physical  CHIEF COMPLAINT:  Wheezing and shortness of breath.  HISTORY OF PRESENT ILLNESS:  This is a 60 year old black female who presents with the above complaints.  The patient states that her symptoms began last Saturday when she went to the mountains, came back home that same evening with a nonproductive cough.  Her cough continued to get worse, especially on Wednesday after visiting the zoo.  Then her cough became productive of a whitish phlegm which progressed to a dark yellowish color.  Yesterday her cough became "unbearable" and she began wheezing.  She went to work today with further worsening of her productive cough and wheezing; she states that she became "delusional" and was assessed by a nurse at work.  REVIEW OF SYSTEMS:  Negative.  PAST MEDICAL HISTORY:  Type 2 diabetes since 1999, history of asthma since approximately 10 years ago.  Not treated, however, per her report.  PAST SURGICAL HISTORY:  Right oophorectomy and appendectomy in 1978.  MEDICATIONS:  Glucophage 500 mg one p.o. q.d.  GYN HISTORY:  Her last menstrual period was on Aug 11, 1999.  She is currently on her period.  SOCIAL HISTORY:  She has been married for four years.  She smokes five cigarettes a day.  She has no children.  She works for [employer] as a Occupational psychologist.  FAMILY HISTORY:  Significant for diabetes, heart disease.  Her father died secondary to CVA.  Her sister had MI at age 47.  PHYSICAL EXAMINATION:  VITAL SIGNS:  Temperature 98.2, pulse 126, blood pressure 143/76, respirations 24, O2 saturation 93% on 3 L nasal cannula O2.  GENERAL:  She has no conversational dyspnea.  She is wheezing and frequently coughs.  HEENT:  EOMI, PERRLA, anicteric sclerae.   Oropharynx is nonerythematous.  NECK:  Supple, no lymphadenopathy, no thyromegaly.  HEART:  Tachycardic, regular rhythm, S1, S2, no murmurs, rubs, or gallops.  LUNGS:  Decreased breath sounds to the mid lobes.  She is clear in the apices. She has diffuse expiratory wheezes.  ABDOMEN:  Soft, obese, bowel sounds present in all quadrants, no organomegaly.  EXTREMITIES:  No pedal edema.  NEUROLOGIC:  Sensory intact.  Cranial nerves 2-12 grossly intact.  Negative Babinski.  LABORATORY:  White count is 13.1, hemoglobin 14.1, hematocrit 42.7, platelets 312, neutrophils 56.  Sodium 138, potassium 4, chloride 106, bicarb 24, BUN 5, creatinine 0.8, glucose 124, calcium 9.7.  ABG 7.45 pH, PCO2 32.2, PO2 53.2, bicarb 29, O2 saturation 89.7%.  ASSESSMENT:  A 60 year old black female who presents with shortness of breath and wheezing.  Will admit for: 1. Asthma exacerbation.  Will give her breathing treatments q.1-2h.  O2 by    nasal cannula to keep her saturations greater than 93%.  Solu-Medrol 60 mg    IV q.12h.  Will check peak flow meter after each breathing treatment. 2. Type 2 diabetes.  Will increase her Glucophage to 500 mg p.o. b.i.d. and    will cover with a sliding scale.  Will check her blood sugars q.i.d. and    h.s. DD:  08/23/99 TD:  08/24/99 Job: [id number]

## 2010-12-23 LAB — RAPID URINE DRUG SCREEN, HOSP PERFORMED
Amphetamines: NOT DETECTED
Benzodiazepines: NOT DETECTED
Cocaine: NOT DETECTED
Tetrahydrocannabinol: NOT DETECTED

## 2010-12-23 LAB — BASIC METABOLIC PANEL
BUN: 15
Chloride: 97
Potassium: 4.4
Sodium: 130 — ABNORMAL LOW

## 2010-12-23 LAB — URINALYSIS, ROUTINE W REFLEX MICROSCOPIC
Glucose, UA: NEGATIVE
Glucose, UA: NEGATIVE
Hgb urine dipstick: NEGATIVE
Hgb urine dipstick: NEGATIVE
Ketones, ur: NEGATIVE
Protein, ur: NEGATIVE
Urobilinogen, UA: 0.2
pH: 5.5

## 2010-12-23 LAB — COMPREHENSIVE METABOLIC PANEL
ALT: 52 — ABNORMAL HIGH
ALT: 45 — ABNORMAL HIGH
AST: 67 — ABNORMAL HIGH
Albumin: 4.5
Alkaline Phosphatase: 112
Calcium: 9.5
Calcium: 10.1
GFR calc Af Amer: 58 — ABNORMAL LOW
GFR calc Af Amer: >60
Glucose, Bld: 110 — ABNORMAL HIGH
Potassium: 4.5
Sodium: 132 — ABNORMAL LOW
Sodium: 136
Total Protein: 8.3
Total Protein: 9 — ABNORMAL HIGH

## 2010-12-23 LAB — HEPARIN LEVEL (UNFRACTIONATED): Heparin Unfractionated: 1.69 — ABNORMAL HIGH

## 2010-12-23 LAB — GLUCOSE, CAPILLARY
Glucose-Capillary: 117 — ABNORMAL HIGH
Glucose-Capillary: 126 — ABNORMAL HIGH
Glucose-Capillary: 126 — ABNORMAL HIGH
Glucose-Capillary: 126 — ABNORMAL HIGH
Glucose-Capillary: 160 — ABNORMAL HIGH

## 2010-12-23 LAB — URINE CULTURE: Colony Count: 7000

## 2010-12-23 LAB — CARDIAC PANEL(CRET KIN+CKTOT+MB+TROPI)
CK, MB: 1.1
CK, MB: 1.5
Total CK: 66
Total CK: 84
Troponin I: 0.02
Troponin I: <0.01

## 2010-12-23 LAB — DIFFERENTIAL
Eosinophils Absolute: 0.1
Lymphs Abs: 3.4
Monocytes Absolute: 0.8
Monocytes Relative: 7
Neutro Abs: 6.4
Neutrophils Relative %: 60

## 2010-12-23 LAB — PROTIME-INR: Prothrombin Time: 13.9

## 2010-12-23 LAB — POCT TOXICOLOGY PANEL

## 2010-12-23 LAB — CBC
HCT: 49.8 — ABNORMAL HIGH
Hemoglobin: 17.2 — ABNORMAL HIGH
MCHC: 33.3
MCHC: 32.6
Platelets: 218
Platelets: 231
Platelets: 220
RDW: 14.7
RDW: 14.3
WBC: 10.1

## 2010-12-23 LAB — LIPID PANEL
Cholesterol: 131
LDL Cholesterol: 80
Total CHOL/HDL Ratio: 4.4

## 2010-12-23 LAB — POCT CARDIAC MARKERS
CKMB, poc: 1.3
Myoglobin, poc: 49.3

## 2010-12-23 LAB — APTT: aPTT: 40 — ABNORMAL HIGH

## 2010-12-23 LAB — LIPASE, BLOOD: Lipase: 54

## 2010-12-23 LAB — HEMOGLOBIN A1C: Mean Plasma Glucose: 140

## 2010-12-23 LAB — POCT B-TYPE NATRIURETIC PEPTIDE (BNP): B Natriuretic Peptide, POC: <5

## 2010-12-23 LAB — AMYLASE: Amylase: 62

## 2010-12-26 LAB — DIFFERENTIAL
Basophils Relative: 1 % (ref 0–1)
Lymphocytes Relative: 36 % (ref 12–46)
Monocytes Relative: 7 % (ref 3–12)
Neutro Abs: 4.8 10*3/uL (ref 1.7–7.7)
Neutrophils Relative %: 55 % (ref 43–77)

## 2010-12-26 LAB — COMPREHENSIVE METABOLIC PANEL
Alkaline Phosphatase: 101 U/L (ref 39–117)
BUN: 8 mg/dL (ref 6–23)
Calcium: 9.7 mg/dL (ref 8.4–10.5)
Creatinine, Ser: 0.82 mg/dL (ref 0.4–1.2)
Glucose, Bld: 124 mg/dL — ABNORMAL HIGH (ref 70–99)
Potassium: 3.4 mEq/L — ABNORMAL LOW (ref 3.5–5.1)
Total Protein: 8.2 g/dL (ref 6.0–8.3)

## 2010-12-26 LAB — IRON AND TIBC
Saturation Ratios: 21 % (ref 20–55)
TIBC: 399 ug/dL (ref 250–470)

## 2010-12-26 LAB — CBC
HCT: 50.8 % — ABNORMAL HIGH (ref 36.0–46.0)
Hemoglobin: 16.8 g/dL — ABNORMAL HIGH (ref 12.0–15.0)
MCHC: 33 g/dL (ref 30.0–36.0)
MCV: 89.5 fL (ref 78.0–100.0)
RDW: 14 % (ref 11.5–15.5)

## 2010-12-26 LAB — LIPID PANEL
Cholesterol: 132 mg/dL (ref 0–200)
Total CHOL/HDL Ratio: 4 RATIO
VLDL: 27 mg/dL (ref 0–40)

## 2011-01-05 LAB — URINALYSIS, ROUTINE W REFLEX MICROSCOPIC
Hgb urine dipstick: NEGATIVE
Protein, ur: NEGATIVE
Urobilinogen, UA: 1

## 2011-01-05 LAB — COMPREHENSIVE METABOLIC PANEL
ALT: 30
Alkaline Phosphatase: 88
CO2: 27
GFR calc non Af Amer: >60
Glucose, Bld: 111 — ABNORMAL HIGH
Potassium: 4.5
Sodium: 141

## 2011-01-05 LAB — CBC
Hemoglobin: 15.6 — ABNORMAL HIGH
RBC: 5.22 — ABNORMAL HIGH

## 2011-01-05 LAB — DIFFERENTIAL
Basophils Relative: 0
Eosinophils Absolute: 0.3
Neutrophils Relative %: 54

## 2011-01-07 LAB — DIFFERENTIAL
Basophils Absolute: 0.1
Eosinophils Absolute: 0.4
Lymphs Abs: 6.3 — ABNORMAL HIGH
Monocytes Relative: 6

## 2011-01-07 LAB — I-STAT 8, (EC8 V) (CONVERTED LAB)
Acid-base deficit: 1
Chloride: 107
HCT: 51 — ABNORMAL HIGH
Operator id: 282201
Potassium: 4.1
TCO2: 27
pH, Ven: 7.326 — ABNORMAL HIGH

## 2011-01-07 LAB — CBC
MCV: 89.5
Platelets: 273
RDW: 14.9 — ABNORMAL HIGH
WBC: 14.1 — ABNORMAL HIGH

## 2011-01-07 LAB — POCT I-STAT CREATININE
Creatinine, Ser: 0.9
Operator id: 282201

## 2011-01-07 LAB — PATHOLOGIST SMEAR REVIEW

## 2011-12-07 ENCOUNTER — Encounter (HOSPITAL_COMMUNITY): Payer: Self-pay | Admitting: *Deleted

## 2011-12-07 ENCOUNTER — Emergency Department (HOSPITAL_COMMUNITY)
Admission: EM | Admit: 2011-12-07 | Discharge: 2011-12-07 | Disposition: A | Discharge status: Home / Self Care | Payer: 59 | Attending: Emergency Medicine | Admitting: Emergency Medicine

## 2011-12-07 DIAGNOSIS — Z7982 Long term (current) use of aspirin: Secondary | ICD-10-CM | POA: Insufficient documentation

## 2011-12-07 DIAGNOSIS — K047 Periapical abscess without sinus: Secondary | ICD-10-CM | POA: Insufficient documentation

## 2011-12-07 DIAGNOSIS — F17200 Nicotine dependence, unspecified, uncomplicated: Secondary | ICD-10-CM | POA: Insufficient documentation

## 2011-12-07 DIAGNOSIS — J45909 Unspecified asthma, uncomplicated: Secondary | ICD-10-CM | POA: Insufficient documentation

## 2011-12-07 DIAGNOSIS — I10 Essential (primary) hypertension: Secondary | ICD-10-CM | POA: Insufficient documentation

## 2011-12-07 HISTORY — DX: Unspecified osteoarthritis, unspecified site: M19.90

## 2011-12-07 HISTORY — DX: Unspecified asthma, uncomplicated: J45.909

## 2011-12-07 HISTORY — DX: Essential (primary) hypertension: I10

## 2011-12-07 HISTORY — DX: Nervousness: R45.0

## 2011-12-07 MED ORDER — BENZOCAINE 10 % MT GEL: OROMUCOSAL | Status: DC | PRN

## 2011-12-07 MED ORDER — AMOXICILLIN 500 MG PO CAPS: 500.0000 mg | ORAL_CAPSULE | Freq: Three times a day (TID) | ORAL | Status: DC

## 2011-12-07 NOTE — ED Provider Notes (Signed)
History     CSN: [id number]  Arrival date & time 12/07/11  1355   First MD Initiated Contact with Patient 12/07/11 1636      Chief Complaint  Patient presents with  . Mouth Lesions    (Consider location/radiation/quality/duration/timing/severity/associated sxs/prior treatment) Patient is a 61 y.o. female presenting with mouth sores. The history is provided by the patient.  Mouth Lesions  The current episode started 3 to 5 days ago. The problem occurs continuously. The problem has been gradually worsening. The problem is moderate. Nothing relieves the symptoms. Associated symptoms include mouth sores. Pertinent negatives include no fever, no ear pain, no headaches, no rhinorrhea, no sore throat and no neck pain.  Pt states she has a painful lesion just behind upper front teeth. States pain with touching and speaking. Sates started 4 days ago, worsening. No other lesions or rash. NO fever. No other complaints.   Past Medical History  Diagnosis Date  . Asthma   . Nervous disorder   . Hypertension   . Arthritis     Past Surgical History  Procedure Date  . Gastric bypass     No family history on file.  History  Substance Use Topics  . Smoking status: Current Every Day Smoker  . Smokeless tobacco: Not on file  . Alcohol Use: No    OB History    Grav Para Term Preterm Abortions TAB SAB Ect Mult Living                  Review of Systems  Constitutional: Negative for fever and chills.  HENT: Positive for mouth sores. Negative for ear pain, sore throat, rhinorrhea, neck pain and neck stiffness.   Respiratory: Negative.   Cardiovascular: Negative.   Gastrointestinal: Negative.   Musculoskeletal: Negative.   Skin: Positive for wound.  Neurological: Negative for dizziness, weakness and headaches.  Psychiatric/Behavioral: Negative.     Allergies  Review of patient's allergies indicates no known allergies.  Home Medications   Current Outpatient Rx  Name Route Sig  Dispense Refill  . ASPIRIN 325 MG PO TABS Oral Take 650 mg by mouth once.    Marland Kitchen ESCITALOPRAM OXALATE 20 MG PO TABS Oral Take 20 mg by mouth daily.      BP 154/98  Pulse 63  Temp 98.7 F (37.1 C) (Oral)  Resp 18  SpO2 99%  Physical Exam  Nursing note and vitals reviewed. Constitutional: She appears well-developed and well-nourished.  HENT:  Head: Normocephalic.  Right Ear: External ear normal.  Left Ear: External ear normal.  Nose: Nose normal.  Mouth/Throat: Oropharynx is clear and moist.       There is an small abscess and swelling just behind central incises. Tender to palpation over the teeth. No facial or sinus tenderness or swelling  Eyes: Conjunctivae normal are normal.  Neck: Neck supple.  Cardiovascular: Normal rate, regular rhythm and normal heart sounds.   Pulmonary/Chest: Effort normal and breath sounds normal. No respiratory distress. She has no wheezes. She has no rales.  Lymphadenopathy:    She has no cervical adenopathy.  Neurological: She is alert.  Skin: Skin is warm and dry.  Psychiatric: She has a normal mood and affect.    ED Course  Procedures (including critical care time)  Pt with a dental infection. Antibitotics. No other lesions in the mouth. Doubt tens or seteven johnson's. Follow up with a dentist. She is afebrile, non toxic. No facial swelling.   1. Dental abscess  MDM          Lottie Mussel, PA 12/08/11 [id number]

## 2011-12-07 NOTE — ED Notes (Signed)
Pt has small blisters in upper mouth started on Thurs and has become worse

## 2011-12-09 NOTE — ED Provider Notes (Signed)
Medical screening examination/treatment/procedure(s) were performed by non-physician practitioner and as supervising physician I was immediately available for consultation/collaboration.  Cyndra Numbers, MD 12/09/11 2230

## 2012-06-04 ENCOUNTER — Encounter (HOSPITAL_COMMUNITY): Payer: Self-pay | Admitting: Emergency Medicine

## 2012-06-04 ENCOUNTER — Ambulatory Visit (INDEPENDENT_AMBULATORY_CARE_PROVIDER_SITE_OTHER): Payer: 59 | Admitting: Emergency Medicine

## 2012-06-04 ENCOUNTER — Emergency Department (HOSPITAL_COMMUNITY)
Admission: EM | Admit: 2012-06-04 | Discharge: 2012-06-04 | Disposition: A | Discharge status: Home / Self Care | Payer: 59 | Attending: Emergency Medicine | Admitting: Emergency Medicine

## 2012-06-04 VITALS — BP 132/76 | HR 88 | Temp 98.0°F | Resp 17 | Ht 65.0 in | Wt 243.0 lb

## 2012-06-04 DIAGNOSIS — F3289 Other specified depressive episodes: Secondary | ICD-10-CM | POA: Insufficient documentation

## 2012-06-04 DIAGNOSIS — F329 Major depressive disorder, single episode, unspecified: Secondary | ICD-10-CM | POA: Insufficient documentation

## 2012-06-04 DIAGNOSIS — R443 Hallucinations, unspecified: Secondary | ICD-10-CM | POA: Insufficient documentation

## 2012-06-04 DIAGNOSIS — Z8669 Personal history of other diseases of the nervous system and sense organs: Secondary | ICD-10-CM | POA: Insufficient documentation

## 2012-06-04 DIAGNOSIS — M129 Arthropathy, unspecified: Secondary | ICD-10-CM | POA: Insufficient documentation

## 2012-06-04 DIAGNOSIS — F17200 Nicotine dependence, unspecified, uncomplicated: Secondary | ICD-10-CM | POA: Insufficient documentation

## 2012-06-04 DIAGNOSIS — J45909 Unspecified asthma, uncomplicated: Secondary | ICD-10-CM | POA: Insufficient documentation

## 2012-06-04 DIAGNOSIS — I10 Essential (primary) hypertension: Secondary | ICD-10-CM | POA: Insufficient documentation

## 2012-06-04 DIAGNOSIS — Z79899 Other long term (current) drug therapy: Secondary | ICD-10-CM | POA: Insufficient documentation

## 2012-06-04 DIAGNOSIS — Z9884 Bariatric surgery status: Secondary | ICD-10-CM | POA: Insufficient documentation

## 2012-06-04 DIAGNOSIS — F411 Generalized anxiety disorder: Secondary | ICD-10-CM | POA: Insufficient documentation

## 2012-06-04 DIAGNOSIS — R440 Auditory hallucinations: Secondary | ICD-10-CM

## 2012-06-04 DIAGNOSIS — E119 Type 2 diabetes mellitus without complications: Secondary | ICD-10-CM | POA: Insufficient documentation

## 2012-06-04 LAB — RAPID URINE DRUG SCREEN, HOSP PERFORMED
Amphetamines: NOT DETECTED
Barbiturates: NOT DETECTED
Benzodiazepines: NOT DETECTED
Cocaine: NOT DETECTED
Opiates: NOT DETECTED
Tetrahydrocannabinol: NOT DETECTED

## 2012-06-04 LAB — COMPREHENSIVE METABOLIC PANEL
ALT: 20 U/L (ref 0–35)
AST: 24 U/L (ref 0–37)
Albumin: 3.6 g/dL (ref 3.5–5.2)
Alkaline Phosphatase: 119 U/L — ABNORMAL HIGH (ref 39–117)
BUN: 8 mg/dL (ref 6–23)
CO2: 28 mEq/L (ref 19–32)
Calcium: 9.5 mg/dL (ref 8.4–10.5)
Chloride: 103 mEq/L (ref 96–112)
Creatinine, Ser: 0.93 mg/dL (ref 0.50–1.10)
GFR calc Af Amer: 76 mL/min — ABNORMAL LOW (ref >90)
GFR calc non Af Amer: 65 mL/min — ABNORMAL LOW (ref >90)
Glucose, Bld: 92 mg/dL (ref 70–99)
Potassium: 3.8 mEq/L (ref 3.5–5.1)
Sodium: 140 mEq/L (ref 135–145)
Total Bilirubin: 0.2 mg/dL — ABNORMAL LOW (ref 0.3–1.2)
Total Protein: 7.9 g/dL (ref 6.0–8.3)

## 2012-06-04 LAB — CBC
HCT: 44.8 % (ref 36.0–46.0)
Hemoglobin: 14.5 g/dL (ref 12.0–15.0)
MCH: 28.4 pg (ref 26.0–34.0)
MCHC: 32.4 g/dL (ref 30.0–36.0)
MCV: 87.7 fL (ref 78.0–100.0)
Platelets: 248 10*3/uL (ref 150–400)
RBC: 5.11 MIL/uL (ref 3.87–5.11)
RDW: 14 % (ref 11.5–15.5)
WBC: 6.5 10*3/uL (ref 4.0–10.5)

## 2012-06-04 LAB — URINE MICROSCOPIC-ADD ON

## 2012-06-04 LAB — URINALYSIS, ROUTINE W REFLEX MICROSCOPIC
Glucose, UA: NEGATIVE mg/dL
Hgb urine dipstick: NEGATIVE
Nitrite: NEGATIVE
Specific Gravity, Urine: 1.03 (ref 1.005–1.030)
pH: 5.5 (ref 5.0–8.0)

## 2012-06-04 LAB — ACETAMINOPHEN LEVEL: Acetaminophen (Tylenol), Serum: <15 ug/mL (ref 10–30)

## 2012-06-04 LAB — SALICYLATE LEVEL: Salicylate Lvl: <2 mg/dL — ABNORMAL LOW (ref 2.8–20.0)

## 2012-06-04 LAB — ETHANOL: Alcohol, Ethyl (B): <11 mg/dL (ref 0–11)

## 2012-06-04 MED ORDER — ACETAMINOPHEN 325 MG PO TABS: 650.0000 mg | ORAL_TABLET | ORAL | Status: DC | PRN

## 2012-06-04 MED ORDER — ARIPIPRAZOLE 10 MG PO TABS: 5.0000 mg | ORAL_TABLET | Freq: Every day | ORAL | Status: DC

## 2012-06-04 MED ORDER — ZOLPIDEM TARTRATE 5 MG PO TABS: 5.0000 mg | ORAL_TABLET | Freq: Every evening | ORAL | Status: DC | PRN

## 2012-06-04 MED ORDER — BENZOCAINE 10 % MT GEL
OROMUCOSAL | Status: DC | PRN
  Filled 2012-06-04: qty 9.4

## 2012-06-04 MED ORDER — ASPIRIN 325 MG PO TABS
650.0000 mg | ORAL_TABLET | Freq: Once | ORAL | Status: AC
  Administered 2012-06-04: 650 mg via ORAL
  Filled 2012-06-04 (×2): qty 2

## 2012-06-04 MED ORDER — IBUPROFEN 600 MG PO TABS: 600.0000 mg | ORAL_TABLET | Freq: Three times a day (TID) | ORAL | Status: DC | PRN

## 2012-06-04 MED ORDER — ONDANSETRON HCL 4 MG PO TABS: 4.0000 mg | ORAL_TABLET | Freq: Three times a day (TID) | ORAL | Status: DC | PRN

## 2012-06-04 MED ORDER — LORAZEPAM 1 MG PO TABS: 1.0000 mg | ORAL_TABLET | Freq: Three times a day (TID) | ORAL | Status: DC | PRN

## 2012-06-04 MED ORDER — ARIPIPRAZOLE 5 MG PO TABS
5.0000 mg | ORAL_TABLET | Freq: Once | ORAL | Status: AC
  Administered 2012-06-04: 5 mg via ORAL
  Filled 2012-06-04: qty 1

## 2012-06-04 MED ORDER — ESCITALOPRAM OXALATE 10 MG PO TABS
20.0000 mg | ORAL_TABLET | Freq: Every day | ORAL | Status: DC
  Administered 2012-06-04: 20 mg via ORAL
  Filled 2012-06-04: qty 2

## 2012-06-04 MED ORDER — ALUM & MAG HYDROXIDE-SIMETH 200-200-20 MG/5ML PO SUSP: 30.0000 mL | ORAL | Status: DC | PRN

## 2012-06-04 NOTE — Progress Notes (Signed)
  Subjective:    Patient ID: [name], female    DOB: [birth year], 62 y.o.   MRN: [id number]  HPI patient enters with a chief complaint that she needs medication for anxiety. Patient is deeply religious but over the past few weeks he is having voices in her head which tell her to hurt herself. The voices tell her to take her fist into her through a window or taken her head and put it through a window and God will protect her and she will not be hurt. She spoke with her therapist last night and she was advised to go to a medical facility for help. She currently is on Lexapro for depression the    Review of Systems     Objective:   Physical Exam patient is alert in no distress. Her neck is supple chest exam reveals rhonchi bilaterally there are no focal neurological signs        Assessment & Plan:  Patient currently hearing voices. These voices are telling her to hurt herself. She is deeply religious and states initially she does not want to go the hospital because she will miss church tomorrow. She states she needs to see her pastor. Finally with the help of her husband she was agreeable to go to [facility] [facility] to be evaluated. I spoke with the triage nurse Patty and they will do the initial mental health evaluation. The patient's therapist is Richard pes [phone]

## 2012-06-04 NOTE — ED Notes (Signed)
telepsych info faxed and called 

## 2012-06-04 NOTE — ED Notes (Signed)
Patient states she currently takes meds for anxiety and has had worsening auditory hallucinations for the past month.  Patient states that the voices state, "Put your hand through that window.  If you're in pain, just go ahead and stop the pain."  Patient states that the voices tell her to hurt herself.  Patient denies HI.
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
## 2012-06-04 NOTE — ED Provider Notes (Signed)
History    62 year old female with command hallucinations. Patient has a history of auditory hallucinations for the past several years. They have course in the past few weeks. The meantime her to do things such as "pretty her hand through that window." "If you're pain, then just go ahead and stop it." No homicidal ideation. No visual hallucinations. Denies any drug use or other acute ingestion.  CSN: [id number]  Arrival date & time 06/04/12  1344   First MD Initiated Contact with Patient 06/04/12 1404      Chief Complaint  Patient presents with  . Medical Clearance    auditory hallucinations    (Consider location/radiation/quality/duration/timing/severity/associated sxs/prior treatment) HPI  Past Medical History  Diagnosis Date  . Asthma   . Nervous disorder   . Hypertension   . Arthritis   . Anxiety   . Diabetes mellitus without complication   . Depression     Past Surgical History  Procedure Laterality Date  . Gastric bypass      Family History  Problem Relation Age of Onset  . Heart disease Father     History  Substance Use Topics  . Smoking status: Current Every Day Smoker -- 0.25 packs/day for 40 years    Types: Cigarettes  . Smokeless tobacco: Not on file  . Alcohol Use: No    OB History   Grav Para Term Preterm Abortions TAB SAB Ect Mult Living                  Review of Systems  All systems reviewed and negative, other than as noted in HPI.   Allergies  Review of patient's allergies indicates no known allergies.  Home Medications   Current Outpatient Rx  Name  Route  Sig  Dispense  Refill  . aspirin 325 MG tablet   Oral   Take 650 mg by mouth once.         . benzocaine (ORAJEL) 10 % mucosal gel   Mouth/Throat   Use as directed in the mouth or throat as needed for pain.   5.3 g   0   . escitalopram (LEXAPRO) 20 MG tablet   Oral   Take 20 mg by mouth daily.         Marland Kitchen ibuprofen (ADVIL,MOTRIN) 200 MG tablet   Oral   Take 200 mg  by mouth every 6 (six) hours as needed for pain.         Marland Kitchen amoxicillin (AMOXIL) 500 MG capsule   Oral   Take 1 capsule (500 mg total) by mouth 3 (three) times daily.   21 capsule   0     BP 147/66  Pulse 98  Temp(Src) 98.5 F (36.9 C) (Oral)  Resp 20  Ht 5\' 5"  (1.651 m)  Wt 243 lb (110.224 kg)  BMI 40.44 kg/m2  SpO2 95%  Physical Exam  Nursing note and vitals reviewed. Constitutional: She is oriented to person, place, and time. She appears well-developed and well-nourished. No distress.  Sitting in bed. NAD. Obese.  HENT:  Head: Normocephalic and atraumatic.  Eyes: Conjunctivae are normal. Right eye exhibits no discharge. Left eye exhibits no discharge.  Neck: Neck supple.  Cardiovascular: Normal rate, regular rhythm and normal heart sounds.  Exam reveals no gallop and no friction rub.   No murmur heard. Pulmonary/Chest: Effort normal and breath sounds normal. No respiratory distress.  Abdominal: Soft. She exhibits no distension. There is no tenderness.  Musculoskeletal: She exhibits no edema and no  tenderness.  Neurological: She is alert and oriented to person, place, and time. She exhibits normal muscle tone. Coordination normal.  Skin: Skin is warm and dry. She is not diaphoretic.  Psychiatric: Her behavior is normal. Thought content normal.  Speech clear. Content appropriate. Does not appear to be cognitively impaired. Mildly anxious.     ED Course  Procedures (including critical care time)  Labs Reviewed  COMPREHENSIVE METABOLIC PANEL - Abnormal; Notable for the following:    Alkaline Phosphatase 119 (*)    Total Bilirubin 0.2 (*)    GFR calc non Af Amer 65 (*)    GFR calc Af Amer 76 (*)    All other components within normal limits  SALICYLATE LEVEL - Abnormal; Notable for the following:    Salicylate Lvl <2.0 (*)    All other components within normal limits  ACETAMINOPHEN LEVEL  CBC  ETHANOL  URINE RAPID DRUG SCREEN (HOSP PERFORMED)   No results  found.   1. Auditory hallucinations       MDM  61yF with auditory command hallucinations to harm self. Voluntary. Pscyh eval.         Raeford Razor, MD 06/04/12 [phone]

## 2012-06-04 NOTE — ED Provider Notes (Signed)
7 PM patient is alert pleasant cooperative. She reports that she hears voices tell her to harm herself however she would not act on the voices. She does not feel herself to be in danger of harming himself or others. Case discussed with Dr. Henderson Cloud, psychiatrist plan prescription Abilify. Outpatient referral for psychiatrist. Patient also sees a therapist currently. No urinalysis noted. Patient denies any urinary symptoms therefore we'll not prescribe antibiotic. Urine sent for culture  Doug Sou, MD 06/04/12 1909

## 2012-06-04 NOTE — Patient Instructions (Addendum)
Please go directly to the emergency room at [facility] and register to be seen and evaluated.

## 2012-06-04 NOTE — ED Notes (Signed)
Patient discharge to home with written and verbal instructions with husband. No acute distress noted.

## 2012-06-07 LAB — URINE CULTURE: Colony Count: >=100000

## 2012-06-08 NOTE — ED Notes (Signed)
+   Urine Chart sent to EDP office for review. 

## 2012-07-18 ENCOUNTER — Ambulatory Visit (HOSPITAL_COMMUNITY)
Admission: AD | Admit: 2012-07-18 | Discharge: 2012-07-18 | Disposition: A | Discharge status: Home / Self Care | Payer: 59 | Attending: Psychiatry | Admitting: Psychiatry

## 2012-07-18 DIAGNOSIS — F333 Major depressive disorder, recurrent, severe with psychotic symptoms: Secondary | ICD-10-CM | POA: Insufficient documentation

## 2012-07-18 DIAGNOSIS — F4310 Post-traumatic stress disorder, unspecified: Secondary | ICD-10-CM | POA: Insufficient documentation
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
## 2012-07-20 ENCOUNTER — Encounter (HOSPITAL_COMMUNITY): Payer: Self-pay

## 2012-07-20 ENCOUNTER — Other Ambulatory Visit (HOSPITAL_COMMUNITY): Payer: 59 | Attending: Psychiatry | Admitting: Psychiatry

## 2012-07-20 DIAGNOSIS — F32A Depression, unspecified: Secondary | ICD-10-CM | POA: Insufficient documentation

## 2012-07-20 DIAGNOSIS — F4310 Post-traumatic stress disorder, unspecified: Secondary | ICD-10-CM

## 2012-07-20 DIAGNOSIS — F411 Generalized anxiety disorder: Secondary | ICD-10-CM | POA: Insufficient documentation

## 2012-07-20 DIAGNOSIS — F332 Major depressive disorder, recurrent severe without psychotic features: Secondary | ICD-10-CM | POA: Insufficient documentation

## 2012-07-20 DIAGNOSIS — F410 Panic disorder [episodic paroxysmal anxiety] without agoraphobia: Secondary | ICD-10-CM

## 2012-07-20 DIAGNOSIS — F333 Major depressive disorder, recurrent, severe with psychotic symptoms: Secondary | ICD-10-CM

## 2012-07-20 DIAGNOSIS — F4481 Dissociative identity disorder: Secondary | ICD-10-CM | POA: Insufficient documentation

## 2012-07-20 DIAGNOSIS — F329 Major depressive disorder, single episode, unspecified: Secondary | ICD-10-CM | POA: Insufficient documentation

## 2012-07-20 MED ORDER — MIRTAZAPINE 15 MG PO TABS: 15.0000 mg | ORAL_TABLET | Freq: Every day | ORAL | Status: DC

## 2012-07-20 MED ORDER — MIRTAZAPINE 15 MG PO TBDP: 15.0000 mg | ORAL_TABLET | Freq: Every day | ORAL | Status: DC
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
## 2012-07-20 NOTE — Progress Notes (Signed)
    Daily Group Progress Note  Program: IOP  Group Time: 9:00-10:30 am   Participation Level: Minimal  Behavioral Response: Appropriate  Type of Therapy:  Process Group  Summary of Progress: Today was patients first day in the group. She appeared engaged and observed the group process.      Group Time: 10:30 am - 12:00 pm   Participation Level:  Active  Behavioral Response: Appropriate  Type of Therapy: Psycho-education Group  Summary of Progress: Patient was introduced to the skill of healthy boundary setting and how to use it to ensure wellness. Patient was assigned homework to explore personal boundaries that need to be set to enhance wellness.  Carman Ching, LCSW

## 2012-07-21 ENCOUNTER — Other Ambulatory Visit (HOSPITAL_COMMUNITY): Payer: 59 | Attending: Psychiatry | Admitting: Psychiatry

## 2012-07-21 DIAGNOSIS — F411 Generalized anxiety disorder: Secondary | ICD-10-CM | POA: Insufficient documentation

## 2012-07-21 DIAGNOSIS — F4310 Post-traumatic stress disorder, unspecified: Secondary | ICD-10-CM | POA: Insufficient documentation

## 2012-07-21 DIAGNOSIS — F332 Major depressive disorder, recurrent severe without psychotic features: Secondary | ICD-10-CM | POA: Insufficient documentation

## 2012-07-21 DIAGNOSIS — F329 Major depressive disorder, single episode, unspecified: Secondary | ICD-10-CM

## 2012-07-21 DIAGNOSIS — F4481 Dissociative identity disorder: Secondary | ICD-10-CM | POA: Insufficient documentation

## 2012-07-21 NOTE — Progress Notes (Signed)
    Daily Group Progress Note  Program: IOP  Group Time: 9:00-10:30 am   Participation Level: Active  Behavioral Response: Appropriate  Type of Therapy:  Process Group  Summary of Progress: Patient was attentive and observed the group process. She did not talk but was alert. She said as her closing group thought that she realized she has permission to say "no".      Group Time: 10:30 am - 12:00 pm   Participation Level:  Active  Behavioral Response: Appropriate  Type of Therapy: Psycho-education Group  Summary of Progress: Patient learned the components to healthy boundary setting following the definition of boundaries yesterday. Patient learned how to set limits to increase personal wellness and safety.  Carman Ching, LCSW

## 2012-07-22 ENCOUNTER — Other Ambulatory Visit (HOSPITAL_COMMUNITY): Payer: 59 | Admitting: Psychiatry

## 2012-07-22 DIAGNOSIS — F329 Major depressive disorder, single episode, unspecified: Secondary | ICD-10-CM
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
## 2012-07-22 NOTE — Progress Notes (Deleted)
[name] is a 62 y.o. female in treatment for *** and displays the following risk factors for Suicide:  Demographic factors:  {CHL AMB BH Suicide Demographics:21022747:a} Current Mental Status: {CHL AMB BH Suicide Current Mental Status:21022748:a} Loss Factors: {CHL AMB BH Suicide Loss Factors:21022749:a} Historical Factors: {CHL AMB BH Suicide Historical Factors:21022750:a} Risk Reduction Factors: {CHL AMB BH Suicide Risk Reduction Factors:21022751:a}  CLINICAL FACTORS:  {Clinical Factors:22706}  COGNITIVE FEATURES THAT CONTRIBUTE TO RISK: {Cognitive Features:22703}    SUICIDE RISK:  {BHH SUICIDE RISK:22704}  Mental Status: *** General Appearance /Behavior:  {BHH GENERAL APPEARANCE/BEHAVIOR:22300} Eye Contact:  {BHH EYE CONTACT:22301} Motor Behavior:  {BHH MOTOR BEHAVIOR:22302} Speech:  {BHH SPEECH:22304} Level of Consciousness:  {BHH LEVEL OF CONSCIOUSNESS:22305} Mood:  {BHH MOOD:22306} Affect:  {BHH AFFECT:22307} Anxiety Level:  {BHH ANXIETY LEVEL:22308} Thought Process:  {BHH THOUGHT PROCESS:22309} Thought Content:  {BHH THOUGHT CONTENT:22310} Perception:  {BHH PERCEPTION:22311} Judgment:  {BHH JUDGMENT:22312} Insight:  {BHH INSIGHT:22313} Cognition:  {BHH COGNITION:22314} Sleep: ***  PLAN OF CARE: ***   Delois Silvester E, LCSW 07/22/2012, 1:30 PM
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
## 2012-07-25 ENCOUNTER — Other Ambulatory Visit (HOSPITAL_COMMUNITY): Payer: 59 | Admitting: Psychiatry

## 2012-07-25 DIAGNOSIS — F329 Major depressive disorder, single episode, unspecified: Secondary | ICD-10-CM
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
## 2012-07-26 ENCOUNTER — Other Ambulatory Visit (HOSPITAL_COMMUNITY): Payer: 59 | Admitting: Psychiatry

## 2012-07-26 DIAGNOSIS — F329 Major depressive disorder, single episode, unspecified: Secondary | ICD-10-CM

## 2012-07-26 MED ORDER — RISPERIDONE 0.5 MG PO TABS: 0.5000 mg | ORAL_TABLET | Freq: Every day | ORAL | Status: DC
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
## 2012-07-26 NOTE — Progress Notes (Signed)
Patient ID: [name], female   DOB: 1950-07-13, 62 y.o.   MRN: [id number] Patient seen.  Chart reviewed.  Complaining of auditory hallucinations and poor sleep.  She she is taking Latuda 40 milligrams for past 4 nights.  Initially she felt improvement in her hallucination however since yesterday she's been feeling more intense hallucination.  In the past she had tried Abilify which help but she could not afford.  The patient denies any suicidal thoughts.  Plan I recommend to discontinue Latuda start Risperdal 0.5 mg at bedtime.  She wants generic medication.  I explained risks and benefits especially EPS, metabolic syndrome and sedation.  Recommend to inform us if she has any questions or concerns worsening of the symptoms

## 2012-07-27 ENCOUNTER — Other Ambulatory Visit (HOSPITAL_COMMUNITY): Payer: 59 | Admitting: Psychiatry

## 2012-07-27 DIAGNOSIS — F329 Major depressive disorder, single episode, unspecified: Secondary | ICD-10-CM
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
## 2012-07-28 ENCOUNTER — Other Ambulatory Visit (HOSPITAL_COMMUNITY): Payer: 59 | Admitting: Psychiatry

## 2012-07-28 DIAGNOSIS — F329 Major depressive disorder, single episode, unspecified: Secondary | ICD-10-CM

## 2012-07-28 NOTE — Progress Notes (Signed)
    Daily Group Progress Note  Program: IOP  Group Time: 9:00-10:30 am   Participation Level: Active  Behavioral Response: Appropriate  Type of Therapy:  Process Group  Summary of Progress: patient reports continued improved mood. She is processing childhood trauma through writing and sharing her experiences with the group. She reports feeling "stronger" and less "fearful" as she is making sense and healing from her trauma.      Group Time: 10:30 am - 12:00 pm   Participation Level:  Active  Behavioral Response: Appropriate  Type of Therapy: Psycho-education Group  Summary of Progress: Patient learned about coping skills and the ineffective coping skills that are used that provide some comfort but also bring an increase of stress as a result. Patient was given homework to think about unhealthy copings skills they use to report back to the group tomorrow.   Carman Ching, LCSW

## 2012-07-29 ENCOUNTER — Other Ambulatory Visit (HOSPITAL_COMMUNITY): Payer: 59 | Admitting: Psychiatry

## 2012-07-29 DIAGNOSIS — F329 Major depressive disorder, single episode, unspecified: Secondary | ICD-10-CM

## 2012-07-29 NOTE — Progress Notes (Signed)
    Daily Group Progress Note  Program: IOP  Group Time: 9:00-10:30 am   Participation Level: Active  Behavioral Response: Appropriate  Type of Therapy:  Process Group  Summary of Progress: Patient was quiet today but reports feeling ok. She continues to work on healing from childhood trauma by identifying feelings, expressing them and connecting with others with similar experiences.      Group Time: 10:30 am - 12:00 pm   Participation Level:  Active  Behavioral Response: Appropriate  Type of Therapy: Psycho-education Group  Summary of Progress:  Patient processed a client crisis that occurred during the break that caused feelings of fear and worry and practiced using distraction skills to reduce anxiety symptoms. Patient also explored tendencies to care take for others and how this effects their personal wellness.   Carman Ching, LCSW

## 2012-08-01 ENCOUNTER — Other Ambulatory Visit (HOSPITAL_COMMUNITY): Payer: 59 | Admitting: Psychiatry

## 2012-08-01 DIAGNOSIS — F329 Major depressive disorder, single episode, unspecified: Secondary | ICD-10-CM
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
## 2012-08-02 ENCOUNTER — Other Ambulatory Visit (HOSPITAL_COMMUNITY): Payer: 59 | Admitting: Psychiatry

## 2012-08-02 DIAGNOSIS — F329 Major depressive disorder, single episode, unspecified: Secondary | ICD-10-CM

## 2012-08-02 NOTE — Progress Notes (Signed)
    Daily Group Progress Note  Program: IOP  Group Time: 9:00-10:30 am   Participation Level: Active  Behavioral Response: Appropriate  Type of Therapy:  Process Group  Summary of Progress: Patient reports feeling "strong" today and working on overcoming her feelings of anger regarding her past trauma. Patient is fluctuating between feeling strong and feeling weak as she grieves what has been taken from her due to her trauma.      Group Time: 10:30 am - 12:00 pm   Participation Level:  Active  Behavioral Response: Appropriate  Type of Therapy: Psycho-education Group  Summary of Progress: Patient learned the DBT Distress Tolerance skill of ACCEPTS and identified items in each area they can do to minimize distress.   Carman Ching, LCSW

## 2012-08-03 ENCOUNTER — Telehealth (HOSPITAL_COMMUNITY): Payer: Self-pay | Admitting: Psychiatry

## 2012-08-03 ENCOUNTER — Other Ambulatory Visit (HOSPITAL_COMMUNITY): Payer: 59

## 2012-08-04 ENCOUNTER — Other Ambulatory Visit (HOSPITAL_COMMUNITY): Payer: 59 | Admitting: Psychiatry

## 2012-08-04 DIAGNOSIS — F329 Major depressive disorder, single episode, unspecified: Secondary | ICD-10-CM

## 2012-08-04 MED ORDER — MIRTAZAPINE 15 MG PO TABS: 15.0000 mg | ORAL_TABLET | Freq: Every day | ORAL | Status: AC

## 2012-08-04 MED ORDER — RISPERIDONE 0.5 MG PO TABS: 0.5000 mg | ORAL_TABLET | Freq: Every day | ORAL | Status: DC

## 2012-08-04 NOTE — Progress Notes (Signed)
    Daily Group Progress Note  Program: IOP  Group Time: 9:00-10:30 am   Participation Level: Active  Behavioral Response: Appropriate  Type of Therapy:  Process Group  Summary of Progress: This was patients scheduled discharge day and she participated in a goodbye ceremony to have closure with the other members. Patient described making progress dealing with her childhood trauma and learning ways to grieve and manage trauma symptoms. Patient states the support of others helped her feel stronger and she learned how to put her experiences into a different perspective that is allowing her to heal.      Group Time: 10:30 am - 12:00 pm   Participation Level:  Active  Behavioral Response: Appropriate  Type of Therapy: Psycho-education Group  Summary of Progress:  patient learned about mental health support groups and programs through the [facility] and learned how to access them for continued support.   [facility]

## 2012-08-04 NOTE — Patient Instructions (Addendum)
Pt completed MH-IOP today.  Will follow up with Dr. Arita Miss and Dr. Evelene Croon (08-24-12).  Pt will call Dr. Para March for a follow up appointment.  Encouraged support groups.

## 2012-08-04 NOTE — Addendum Note (Signed)
Addended by: Kathryne Sharper T on: 08/04/2012 11:38 AM   Modules accepted: Orders

## 2012-08-04 NOTE — Progress Notes (Signed)
Patient ID: [name], female   DOB: Jun 09, 1950, 62 y.o.   MRN: [id number] Discharge Note  Patient:  [name] is an 62 y.o., female DOB:  Aug 13, 1950  Date of Admission:  (Not on file)  Date of Discharge:  08/04/12  Reason for Admission:  Please see admission note.  Hospital Course:  The patient completed the intensive outpatient program.  Initially she was started on Latuda however she started to have worsening of hallucination and insomnia.  We started her on Risperdal 0.5 mg.  She is doing much better.  She likes the program.  She felt increased energy and concentration.  She denies any crying spells.  She denies any side effects of medication.  She denies any active or passive suicidal thoughts or homicidal thoughts.  She's taking Lexapro Remeron and Risperdal.  She likes to continue her medication.  Mental Status at Discharge:  Patient is an elderly woman who is casually dressed.  She maintained good eye contact.  Her speech is soft clear and coherent.  She described her mood is neutral and her affect is mood appropriate.  She denies any active or passive suicidal thoughts or homicidal thoughts.  She denies any auditory or visual hallucination.  There were no paranoia or delusion obsession present at this time.  She's alert and oriented x3 there were no tremors or shakes.  Her insight judgment and impulse control is okay.  Lab Results: No results found for this or any previous visit (from the past 48 hour(s)).  Current outpatient prescriptions:amoxicillin (AMOXIL) 500 MG capsule, Take 1 capsule (500 mg total) by mouth 3 (three) times daily., Disp: 21 capsule, Rfl: 0;  aspirin 325 MG tablet, Take 650 mg by mouth once., Disp: , Rfl: ;  escitalopram (LEXAPRO) 20 MG tablet, Take 20 mg by mouth daily., Disp: , Rfl: ;  ibuprofen (ADVIL,MOTRIN) 200 MG tablet, Take 200 mg by mouth every 6 (six) hours as needed for pain., Disp: , Rfl:  mirtazapine (REMERON) 15 MG tablet, Take 1 tablet (15 mg total)  by mouth at bedtime., Disp: 30 tablet, Rfl: 0;  risperiDONE (RISPERDAL) 0.5 MG tablet, Take 1 tablet (0.5 mg total) by mouth at bedtime., Disp: 30 tablet, Rfl: 0  Axis Diagnosis:  Axis I: Depressive Disorder NOS   Level of Care:  OP  Discharge destination:  Home  Is patient on multiple antipsychotic therapies at discharge:  No    Has Patient had three or more failed trials of antipsychotic monotherapy by history:  No  Patient phone:  [phone] (home)  Patient address:   [address] [place] [address],   Follow-up recommendations:  Activity:  As tolerated. Diet:  No change Tests:  None Other:  Patient will be followup at Dr. Lafayette Dragon on 08/24/2012 and Dr. Daphene Calamity for counseling.  Patient is aware about her appointments.  Comments:   Prescription of Risperdal 0.5 mg #30 and Remeron 15 mg at bedtime #30 is given.  The patient received suicide prevention pamphlet:  Yes Belongings returned:  Clothing, Medications and Valuables  ARFEEN,SYED T. 08/04/2012, 11:32 AM
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
## 2012-08-05 ENCOUNTER — Other Ambulatory Visit (HOSPITAL_COMMUNITY): Payer: 59

## 2012-08-08 ENCOUNTER — Other Ambulatory Visit (HOSPITAL_COMMUNITY): Payer: 59

## 2012-08-09 ENCOUNTER — Other Ambulatory Visit (HOSPITAL_COMMUNITY): Payer: 59

## 2012-08-10 ENCOUNTER — Other Ambulatory Visit (HOSPITAL_COMMUNITY): Payer: 59

## 2012-08-11 ENCOUNTER — Other Ambulatory Visit (HOSPITAL_COMMUNITY): Payer: 59

## 2012-08-12 ENCOUNTER — Other Ambulatory Visit (HOSPITAL_COMMUNITY): Payer: 59

## 2012-08-16 ENCOUNTER — Other Ambulatory Visit (HOSPITAL_COMMUNITY): Payer: 59

## 2012-08-17 ENCOUNTER — Other Ambulatory Visit (HOSPITAL_COMMUNITY): Payer: 59

## 2012-08-18 ENCOUNTER — Other Ambulatory Visit (HOSPITAL_COMMUNITY): Payer: 59

## 2012-08-19 ENCOUNTER — Other Ambulatory Visit (HOSPITAL_COMMUNITY): Payer: 59

## 2012-08-22 ENCOUNTER — Other Ambulatory Visit (HOSPITAL_COMMUNITY): Payer: 59

## 2012-08-23 ENCOUNTER — Other Ambulatory Visit (HOSPITAL_COMMUNITY): Payer: 59

## 2012-08-24 ENCOUNTER — Other Ambulatory Visit (HOSPITAL_COMMUNITY): Payer: 59

## 2012-08-25 ENCOUNTER — Other Ambulatory Visit (HOSPITAL_COMMUNITY): Payer: 59

## 2012-08-26 ENCOUNTER — Other Ambulatory Visit (HOSPITAL_COMMUNITY): Payer: 59

## 2012-08-29 ENCOUNTER — Other Ambulatory Visit (HOSPITAL_COMMUNITY): Payer: 59

## 2012-10-24 ENCOUNTER — Other Ambulatory Visit (HOSPITAL_COMMUNITY): Payer: Self-pay | Admitting: Psychiatry

## 2019-04-26 NOTE — Progress Notes (Signed)
Patient referred by Lin Landsman, MD for leg edema  Subjective:   [name], female    DOB: Oct 09, 1950, 69 y.o.   MRN: [id number]   Chief Complaint  Patient presents with  . Leg Swelling  . New Patient (Initial Visit)    HPI  69 y.o. Afric[place] female with hypertension, pre-diabetes, morbid obesity BMI 50, tobacco dependence, referred for evaluation of leg edema.  Patient lives sedentary lifestyle, smokes daily, not willing to quit. She has family h/o early CAD. She wants to undergo breast reduction surgery. She denies any exertional chest pain, dyspnea symptoms with her minimal physical activity. 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    Past Medical History:  Diagnosis Date  . Anxiety   . Arthritis   . Asthma   . Depression   . Diabetes mellitus without complication   . Hypertension   . Nervous disorder      Past Surgical History:  Procedure Laterality Date  . GASTRIC BYPASS       Social History   Tobacco Use  Smoking Status Current Every Day Smoker  . Packs/day: 0.25  . Years: 40.00  . Pack years: 10.00  . Types: Cigarettes  Smokeless Tobacco Never Used    Social History   Substance and Sexual Activity  Alcohol Use No     Family History  Problem Relation Age of Onset  . Heart disease Father   . Depression Mother   . Heart attack Sister 42  . Heart attack Sister 52  . Heart attack Sister   . Anorexia nervosa Sister      Current Outpatient Medications on File Prior to Visit  Medication Sig Dispense Refill  . aspirin 325 MG tablet Take 650 mg by mouth once.    . escitalopram (LEXAPRO) 20 MG tablet Take 20 mg by mouth daily.    Marland Kitchen ibuprofen (ADVIL,MOTRIN) 200 MG tablet Take 200 mg by mouth every 6 (six) hours as needed for pain.    Marland Kitchen risperiDONE (RISPERDAL) 0.5 MG tablet Take 1 tablet (0.5 mg total) by mouth at bedtime. 30 tablet 0   No current facility-administered medications on  file prior to visit.    Cardiovascular and other pertinent studies:  EKG 04/27/2019: Sinus rhythm 83 bpm. Normal EKG.   Recent labs: 04/11/2019: Glucose 91, BUN/Cr 14/1.0.  EGFR 68. Na/K 139/5.1.  Albumin: 3.5, Globulin: 3.9, Albumin/Globulin Ratio: 0.9 H/H 13.7/44.3. MCV 78.3. Platelets 247 HbA1C 5.9 % Chol 138, TG 61, HDL 38, LDL 85 TSH 1.87 normal    Review of Systems  Cardiovascular: Negative for chest pain, dyspnea on exertion, leg swelling, palpitations and syncope.         Vitals:   04/27/19 1401  BP: 136/75  Pulse: 87  Resp: 16  Temp: 97.6 F (36.4 C)  SpO2: 98%     Body mass index is 50.24 kg/m. Filed Weights   04/27/19 1401  Weight: (!) 301 lb 14.4 oz (136.9 kg)     Objective:   Physical Exam  Constitutional: She appears well-developed and well-nourished.  Morbidly obese  Neck: No JVD present.  Cardiovascular: Normal rate, regular rhythm, normal heart sounds and intact distal pulses.  No murmur heard. Pulmonary/Chest: Effort normal and breath sounds normal. She has no wheezes. She has no rales.  Musculoskeletal:        General: Edema (1+ b/l) present.  Nursing note and vitals reviewed.       Assessment &  Recommendations:    69 y.o. [place] female with hypertension, pre-diabetes, morbid obesity BMI 50, tobacco dependence, referred for evaluation of leg edema.  Pre-op risk stratification: Patient is here for preoperative stratification for breast reduction surgery.  She has not had evidence Of CAD, including family history of early CAD, and tobacco abuse.  No imaging tests would be reliable given her BMI of 50 and large breast tissue.  I recommended that with her physical activity and assess for any angina symptoms thereafter. I will try to obtain echocardiogram., although this will also be limited due to her body habitus. Recommend Aspirin 81 mg, for now.   Obesity: Counseled regarding calorie negative diet and regular walking.  Gave  weight loss goal of 285 lb by next visit.  Leg edema: Echocardiogram. Started lasix 20 mg daily.  Tobacco dependence: Tobacco cessation counseling:  - Currently smoking 1/4 packs/day   - Patient was informed of the dangers of tobacco abuse including stroke, cancer, and MI, as well as benefits of tobacco cessation. - Patient is NOT willing to quit at this time. - Approximately 4 mins were spent counseling patient cessation techniques.  - I will reassess her progress at the next follow-up visit   F/u in 2 months   Thank you for referring the patient to [place]. Please feel free to contact with any questions.  Nigel Mormon, MD [facility] Pager: [phone] Office: [phone]

## 2019-04-27 ENCOUNTER — Encounter: Payer: Self-pay | Admitting: Cardiology

## 2019-04-27 ENCOUNTER — Other Ambulatory Visit: Payer: Self-pay

## 2019-04-27 ENCOUNTER — Ambulatory Visit (INDEPENDENT_AMBULATORY_CARE_PROVIDER_SITE_OTHER): Payer: Medicare HMO | Admitting: Cardiology

## 2019-04-27 VITALS — BP 136/75 | HR 87 | Temp 97.6°F | Resp 16 | Ht 65.0 in | Wt 301.9 lb

## 2019-04-27 DIAGNOSIS — Z8249 Family history of ischemic heart disease and other diseases of the circulatory system: Secondary | ICD-10-CM

## 2019-04-27 DIAGNOSIS — R600 Localized edema: Secondary | ICD-10-CM

## 2019-04-27 DIAGNOSIS — F17200 Nicotine dependence, unspecified, uncomplicated: Secondary | ICD-10-CM | POA: Diagnosis not present

## 2019-04-27 MED ORDER — ASPIRIN EC 81 MG PO TBEC: 81.0000 mg | DELAYED_RELEASE_TABLET | Freq: Every day | ORAL | 1 refills | Status: AC

## 2019-04-27 MED ORDER — FUROSEMIDE 20 MG PO TABS
20.0000 mg | ORAL_TABLET | Freq: Every day | ORAL | 2 refills | Status: DC
Start: 2019-04-27 — End: 2020-01-24

## 2019-06-26 ENCOUNTER — Other Ambulatory Visit: Payer: Self-pay

## 2019-06-26 ENCOUNTER — Encounter: Payer: Self-pay | Admitting: Cardiology

## 2019-06-26 ENCOUNTER — Ambulatory Visit: Payer: Medicare HMO | Admitting: Cardiology

## 2019-06-26 VITALS — BP 139/76 | HR 88 | Temp 97.8°F | Ht 65.0 in | Wt 294.0 lb

## 2019-06-26 DIAGNOSIS — R600 Localized edema: Secondary | ICD-10-CM

## 2019-06-26 DIAGNOSIS — Z8249 Family history of ischemic heart disease and other diseases of the circulatory system: Secondary | ICD-10-CM

## 2019-06-26 NOTE — Progress Notes (Signed)
    Patient referred by Lin Landsman, MD for leg edema  Subjective:   [name], female    DOB: 14-Jan-1951, 69 y.o.   MRN: [id number]   Chief Complaint  Patient presents with  . Pre-op Exam    HPI  69 y.o. Afric[place] female with hypertension, pre-diabetes, morbid obesity BMI 50, tobacco dependence, leg edema.  At last visit, I prescribed lasix 20 mg daily. I had recommended echocardiogram, which is still pending. She is doing well, does no thave any overt chest pain symptoms. She is hoping to undergo breast reduction surgery soon.    Current Outpatient Medications on File Prior to Visit  Medication Sig Dispense Refill  . aspirin EC 81 MG tablet Take 1 tablet (81 mg total) by mouth daily. 90 tablet 1  . escitalopram (LEXAPRO) 20 MG tablet Take 20 mg by mouth daily.    . furosemide (LASIX) 20 MG tablet Take 1 tablet (20 mg total) by mouth daily. 90 tablet 2  . risperiDONE (RISPERDAL) 0.5 MG tablet Take 1 tablet (0.5 mg total) by mouth at bedtime. 30 tablet 0   No current facility-administered medications on file prior to visit.    Cardiovascular and other pertinent studies:  EKG 06/26/2019: Sinus rhythm 90 bpm. Normal EKG.  Recent labs: 04/11/2019: Glucose 91, BUN/Cr 14/1.0.  EGFR 68. Na/K 139/5.1.  Albumin: 3.5, Globulin: 3.9, Albumin/Globulin Ratio: 0.9 H/H 13.7/44.3. MCV 78.3. Platelets 247 HbA1C 5.9 % Chol 138, TG 61, HDL 38, LDL 85 TSH 1.87 normal    Review of Systems  Cardiovascular: Negative for chest pain, dyspnea on exertion, leg swelling, palpitations and syncope.         Vitals:   06/26/19 1331  BP: 139/76  Pulse: 88  Temp: 97.8 F (36.6 C)  SpO2: 95%     Body mass index is 48.92 kg/m. Filed Weights   06/26/19 1331  Weight: 294 lb (133.4 kg)     Objective:   Physical Exam  Constitutional: She appears well-developed and well-nourished.  Morbidly obese  Neck: No JVD present.  Cardiovascular: Normal rate, regular rhythm, normal heart  sounds and intact distal pulses.  No murmur heard. Pulmonary/Chest: Effort normal and breath sounds normal. She has no wheezes. She has no rales.  Musculoskeletal:        General: No edema.  Nursing note and vitals reviewed.       Assessment & Recommendations:    69 y.o. Afric[place] female with hypertension, pre-diabetes, morbid obesity BMI 50, tobacco dependence, leg edema.  Pre-op risk stratification: Patient is here for preoperative stratification for breast reduction surgery.  She has not had evidence Of CAD, including family history of early CAD, and tobacco abuse.  No imaging tests would be reliable given her BMI of 50 and large breast tissue.  I recommended that with her physical activity and assess for any angina symptoms thereafter. I will try to obtain echocardiogram., although this will also be limited due to her body habitus. Recommend Aspirin 81 mg, for now.   Obesity: Counseled regarding calorie negative diet and regular walking.  Congratulated on weight loss.  Leg edema: Echocardiogram pending. Continue lasix 20 mg daily.  F/u in 6 months  Habib Kise Esther Hardy, MD [facility] [place] Pager: [phone] Office: [phone]

## 2019-07-03 ENCOUNTER — Other Ambulatory Visit: Payer: Self-pay

## 2019-07-03 DIAGNOSIS — Z01818 Encounter for other preprocedural examination: Secondary | ICD-10-CM

## 2019-07-04 ENCOUNTER — Other Ambulatory Visit: Payer: Self-pay

## 2019-07-04 ENCOUNTER — Ambulatory Visit: Payer: Medicare HMO

## 2019-07-10 NOTE — Progress Notes (Signed)
Called patient, she is aware of echocardiogram results. She had a question regarding the surgical clearance letter, I advised her you would give her a call regarding that.

## 2019-12-29 ENCOUNTER — Ambulatory Visit: Payer: Medicare HMO | Admitting: Cardiology

## 2019-12-29 NOTE — Progress Notes (Deleted)
Patient referred by Lin Landsman, MD for leg edema  Subjective:   [name], female    DOB: [birth year], 69 y.o.   MRN: [id number]   No chief complaint on file.   HPI  69 y.o. Afric[place] female with hypertension, pre-diabetes, morbid obesity BMI 50, tobacco dependence, referred for evaluation of leg edema.  Patient lives sedentary lifestyle, smokes daily, not willing to quit. She has family h/o early CAD. She wants to undergo breast reduction surgery. She denies any exertional chest pain, dyspnea symptoms with her minimal physical activity. 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    Past Medical History:  Diagnosis Date  . Anxiety   . Arthritis   . Asthma   . Depression   . Diabetes mellitus without complication ([place])   . Hypertension   . Nervous disorder      Past Surgical History:  Procedure Laterality Date  . GASTRIC BYPASS       Social History   Tobacco Use  Smoking Status Current Every Day Smoker  . Packs/day: 0.25  . Years: 40.00  . Pack years: 10.00  . Types: Cigarettes  Smokeless Tobacco Never Used    Social History   Substance and Sexual Activity  Alcohol Use No     Family History  Problem Relation Age of Onset  . Heart disease Father   . Depression Mother   . Heart attack Sister 51  . Heart attack Sister 34  . Heart attack Sister   . Anorexia nervosa Sister      Current Outpatient Medications on File Prior to Visit  Medication Sig Dispense Refill  . amoxicillin (AMOXIL) 500 MG capsule Take 1 capsule by mouth daily.    Marland Kitchen aspirin EC 81 MG tablet Take 1 tablet (81 mg total) by mouth daily. 90 tablet 1  . furosemide (LASIX) 20 MG tablet Take 1 tablet (20 mg total) by mouth daily. 90 tablet 2  . phentermine 15 MG capsule      No current facility-administered medications on file prior to visit.    Cardiovascular and other pertinent studies:  EKG 04/27/2019: Sinus rhythm 83  bpm. Normal EKG.   Recent labs: 04/11/2019: Glucose 91, BUN/Cr 14/1.0.  EGFR 68. Na/K 139/5.1.  Albumin: 3.5, Globulin: 3.9, Albumin/Globulin Ratio: 0.9 H/H 13.7/44.3. MCV 78.3. Platelets 247 HbA1C 5.9 % Chol 138, TG 61, HDL 38, LDL 85 TSH 1.87 normal    Review of Systems  Cardiovascular: Negative for chest pain, dyspnea on exertion, leg swelling, palpitations and syncope.         There were no vitals filed for this visit.   There is no height or weight on file to calculate BMI. There were no vitals filed for this visit.   Objective:   Physical Exam Vitals and nursing note reviewed.  Constitutional:      Appearance: She is well-developed.     Comments: Morbidly obese  Neck:     Vascular: No JVD.  Cardiovascular:     Rate and Rhythm: Normal rate and regular rhythm.     Pulses: Intact distal pulses.     Heart sounds: Normal heart sounds. No murmur heard.   Pulmonary:     Effort: Pulmonary effort is normal.     Breath sounds: Normal breath sounds. No wheezing or rales.         Assessment & Recommendations:    69 y.o. [place] female with hypertension, pre-diabetes, morbid obesity BMI  50, tobacco dependence, referred for evaluation of leg edema.  Pre-op risk stratification: Patient is here for preoperative stratification for breast reduction surgery.  She has not had evidence Of CAD, including family history of early CAD, and tobacco abuse.  No imaging tests would be reliable given her BMI of 50 and large breast tissue.  I recommended that with her physical activity and assess for any angina symptoms thereafter. I will try to obtain echocardiogram., although this will also be limited due to her body habitus. Recommend Aspirin 81 mg, for now.   Obesity: Counseled regarding calorie negative diet and regular walking.  Gave weight loss goal of 285 lb by next visit.  Leg edema: Echocardiogram. Started lasix 20 mg daily.  Tobacco dependence: Tobacco cessation  counseling:  - Currently smoking 1/4 packs/day   - Patient was informed of the dangers of tobacco abuse including stroke, cancer, and MI, as well as benefits of tobacco cessation. - Patient is NOT willing to quit at this time. - Approximately 4 mins were spent counseling patient cessation techniques.  - I will reassess her progress at the next follow-up visit   F/u in 2 months   Thank you for referring the patient to [place]. Please feel free to contact with any questions.  Nigel Mormon, MD [facility] [place] Pager: [phone] Office: [phone]

## 2020-01-24 ENCOUNTER — Other Ambulatory Visit: Payer: Self-pay | Admitting: Cardiology

## 2020-01-24 DIAGNOSIS — R600 Localized edema: Secondary | ICD-10-CM
